# Patient Record
Sex: Female | Born: 2000 | Race: White | Hispanic: No | Marital: Single | Smoking: Former smoker
Health system: Southern US, Community
[De-identification: ages and names within clinical notes are randomized; demographics above are authoritative.]

## PROBLEM LIST (undated history)

## (undated) DIAGNOSIS — Z8616 Personal history of COVID-19: Secondary | ICD-10-CM

## (undated) DIAGNOSIS — F99 Mental disorder, not otherwise specified: Secondary | ICD-10-CM

## (undated) HISTORY — PX: TYMPANOSTOMY TUBE PLACEMENT: SHX32

## (undated) HISTORY — DX: Personal history of COVID-19: Z86.16

## (undated) HISTORY — DX: Mental disorder, not otherwise specified: F99

---

## 2020-10-25 ENCOUNTER — Emergency Department (INDEPENDENT_AMBULATORY_CARE_PROVIDER_SITE_OTHER)
Admission: EM | Admit: 2020-10-25 | Discharge: 2020-10-25 | Disposition: A | Discharge status: Home / Self Care | Payer: 59 | Source: Home / Self Care

## 2020-10-25 ENCOUNTER — Other Ambulatory Visit: Payer: Self-pay

## 2020-10-25 DIAGNOSIS — R519 Headache, unspecified: Secondary | ICD-10-CM

## 2020-10-25 DIAGNOSIS — Z20822 Contact with and (suspected) exposure to covid-19: Secondary | ICD-10-CM

## 2020-10-25 DIAGNOSIS — J069 Acute upper respiratory infection, unspecified: Secondary | ICD-10-CM

## 2020-10-25 NOTE — ED Triage Notes (Signed)
Patient presents to Urgent Care with complaints of headaches, body aches, chills, hot flashes since two days ago. Patient reports she was exposed to covid recently, has a productive cough, would like to be tested for covid.

## 2020-10-25 NOTE — Discharge Instructions (Signed)
°  You may take 500mg acetaminophen every 4-6 hours or in combination with ibuprofen 400-600mg every 6-8 hours as needed for pain, inflammation, and fever. ° °Be sure to well hydrated with clear liquids and get at least 8 hours of sleep at night, preferably more while sick.  ° °Please follow up with family medicine in 1 week if needed. ° °CDC recommends isolation for 5 days from symptom onset and fever free for 24 hours without use of fever reducing medication and symptoms improving. Then wear a well fitting mask for 5 days when around others. If not improving, isolate for 10 days and consider reevaluation by a medical provider to rule out a secondary bacterial infection.  ° °Call 911 or have someone drive you to the hospital if symptoms significantly worsening. ° ° °

## 2020-10-25 NOTE — ED Provider Notes (Signed)
[facility]    CSN: [id number] Arrival date & time: 10/25/20  1850      History   Chief Complaint Chief Complaint  Patient presents with  . Headache    HPI Diana Carpenter is a 20 y.o. female.   HPI Diana Carpenter is a 20 y.o. female presenting to UC with c/o generalized HA, body aches, chills, and hot flashes that started 2 days ago.  Known exposure to COVID, associated productive cough. Pt requesting a COVID tet. Denies fever, n/v/d. No chest pain or SOB.    History reviewed. No pertinent past medical history.  There are no problems to display for this patient.   History reviewed. No pertinent surgical history.  OB History   No obstetric history on file.      Home Medications    Prior to Admission medications   Not on File    Family History Family History  Problem Relation Age of Onset  . Hypertension Mother   . Cirrhosis Father     Social History Social History   Tobacco Use  . Smoking status: Never Smoker  . Smokeless tobacco: Never Used  Vaping Use  . Vaping Use: Every day  . Substances: Nicotine, Flavoring     Allergies   Patient has no known allergies.   Review of Systems Review of Systems  Constitutional: Positive for chills. Negative for fever.  HENT: Positive for congestion. Negative for ear pain, sore throat, trouble swallowing and voice change.   Respiratory: Positive for cough. Negative for shortness of breath.   Cardiovascular: Negative for chest pain and palpitations.  Gastrointestinal: Negative for abdominal pain, diarrhea, nausea and vomiting.  Musculoskeletal: Negative for arthralgias, back pain and myalgias.  Skin: Negative for rash.  Neurological: Positive for headaches. Negative for dizziness and light-headedness.  All other systems reviewed and are negative.    Physical Exam Triage Vital Signs ED Triage Vitals  Enc Vitals Group     BP 10/25/20 1913 (!) 144/73     Pulse Rate 10/25/20 1913 90     Resp  10/25/20 1913 16     Temp 10/25/20 1913 98.7 F (37.1 C)     Temp Source 10/25/20 1913 Oral     SpO2 10/25/20 1913 98 %     Weight --      Height --      Head Circumference --      Peak Flow --      Pain Score 10/25/20 1910 6     Pain Loc --      Pain Edu? --      Excl. in GC? --    No data found.  Updated Vital Signs BP (!) 144/73 (BP Location: Left Arm)   Pulse 90   Temp 98.7 F (37.1 C) (Oral)   Resp 16   LMP 10/15/2020 (Exact Date)   SpO2 98%   Visual Acuity Right Eye Distance:   Left Eye Distance:   Bilateral Distance:    Right Eye Near:   Left Eye Near:    Bilateral Near:     Physical Exam Vitals and nursing note reviewed.  Constitutional:      General: She is not in acute distress.    Appearance: She is well-developed and well-nourished. She is not ill-appearing, toxic-appearing or diaphoretic.  HENT:     Head: Normocephalic and atraumatic.     Right Ear: Tympanic membrane and ear canal normal.     Left Ear: Tympanic membrane and ear canal  normal.     Nose: Nose normal.     Right Sinus: No maxillary sinus tenderness or frontal sinus tenderness.     Left Sinus: No maxillary sinus tenderness or frontal sinus tenderness.     Mouth/Throat:     Lips: Pink.     Mouth: Mucous membranes are moist.     Pharynx: Oropharynx is clear. Uvula midline.  Eyes:     Extraocular Movements: EOM normal.  Cardiovascular:     Rate and Rhythm: Normal rate and regular rhythm.  Pulmonary:     Effort: Pulmonary effort is normal. No respiratory distress.     Breath sounds: Normal breath sounds. No stridor. No wheezing, rhonchi or rales.  Musculoskeletal:        General: Normal range of motion.     Cervical back: Normal range of motion and neck supple.  Skin:    General: Skin is warm and dry.  Neurological:     Mental Status: She is alert and oriented to person, place, and time.  Psychiatric:        Mood and Affect: Mood and affect normal.        Behavior: Behavior normal.       UC Treatments / Results  Labs (all labs ordered are listed, but only abnormal results are displayed) Labs Reviewed  COVID-19, FLU A+B NAA    EKG   Radiology No results found.  Procedures Procedures (including critical care time)  Medications Ordered in UC Medications - No data to display  Initial Impression / Assessment and Plan / UC Course  I have reviewed the triage vital signs and the nursing notes.  Pertinent labs & imaging results that were available during my care of the patient were reviewed by me and considered in my medical decision making (see chart for details).     No evidence of bacterial infection at this time. COVID/Flu pending Encouraged symptomatic tx F/u with PCP as needed AVS given  Final Clinical Impressions(s) / UC Diagnoses   Final diagnoses:  Close exposure to COVID-19 virus  Generalized headache  Viral upper respiratory tract infection with cough     Discharge Instructions      You may take 500mg  acetaminophen every 4-6 hours or in combination with ibuprofen 400-600mg  every 6-8 hours as needed for pain, inflammation, and fever.  Be sure to well hydrated with clear liquids and get at least 8 hours of sleep at night, preferably more while sick.   Please follow up with family medicine in 1 week if needed.  CDC recommends isolation for 5 days from symptom onset and fever free for 24 hours without use of fever reducing medication and symptoms improving. Then wear a well fitting mask for 5 days when around others. If not improving, isolate for 10 days and consider reevaluation by a medical provider to rule out a secondary bacterial infection.   Call 911 or have someone drive you to the hospital if symptoms significantly worsening.     ED Prescriptions    None     PDMP not reviewed this encounter.   , Lurene Shadow 10/26/20 1103

## 2020-10-28 ENCOUNTER — Telehealth: Payer: Self-pay | Admitting: Emergency Medicine

## 2020-10-28 LAB — COVID-19, FLU A+B NAA
Influenza A, NAA: NOT DETECTED
Influenza B, NAA: NOT DETECTED
SARS-CoV-2, NAA: DETECTED — AB

## 2020-10-28 NOTE — Telephone Encounter (Signed)
LMTRC.  Patient had called with questions regarding her COVID results.

## 2020-10-30 ENCOUNTER — Encounter: Payer: 59 | Admitting: Obstetrics & Gynecology

## 2020-10-31 ENCOUNTER — Encounter: Payer: 59 | Admitting: Advanced Practice Midwife

## 2020-11-07 ENCOUNTER — Ambulatory Visit (INDEPENDENT_AMBULATORY_CARE_PROVIDER_SITE_OTHER): Payer: 59 | Admitting: Certified Nurse Midwife

## 2020-11-07 ENCOUNTER — Other Ambulatory Visit (HOSPITAL_COMMUNITY)
Admission: RE | Admit: 2020-11-07 | Discharge: 2020-11-07 | Disposition: A | Discharge status: Home / Self Care | Payer: 59 | Source: Ambulatory Visit | Attending: Certified Nurse Midwife | Admitting: Certified Nurse Midwife

## 2020-11-07 ENCOUNTER — Other Ambulatory Visit: Payer: Self-pay

## 2020-11-07 ENCOUNTER — Encounter: Payer: Self-pay | Admitting: Certified Nurse Midwife

## 2020-11-07 VITALS — BP 104/70 | HR 100 | Resp 16 | Ht 64.0 in | Wt 147.0 lb

## 2020-11-07 DIAGNOSIS — Z113 Encounter for screening for infections with a predominantly sexual mode of transmission: Secondary | ICD-10-CM | POA: Diagnosis present

## 2020-11-07 DIAGNOSIS — Z01419 Encounter for gynecological examination (general) (routine) without abnormal findings: Secondary | ICD-10-CM | POA: Diagnosis present

## 2020-11-07 DIAGNOSIS — Z3009 Encounter for other general counseling and advice on contraception: Secondary | ICD-10-CM

## 2020-11-07 DIAGNOSIS — Z30011 Encounter for initial prescription of contraceptive pills: Secondary | ICD-10-CM

## 2020-11-07 LAB — POCT URINE PREGNANCY: Preg Test, Ur: NEGATIVE

## 2020-11-07 MED ORDER — TERCONAZOLE 0.4 % VA CREA: 1.0000 | TOPICAL_CREAM | Freq: Every day | VAGINAL | 0 refills | Status: DC

## 2020-11-07 MED ORDER — NORETHIN ACE-ETH ESTRAD-FE 1-20 MG-MCG(24) PO TABS: 1.0000 | ORAL_TABLET | Freq: Every day | ORAL | 3 refills | Status: DC

## 2020-11-07 MED ORDER — TOPIRAMATE 50 MG PO TABS: 50.0000 mg | ORAL_TABLET | Freq: Two times a day (BID) | ORAL | 1 refills | Status: DC

## 2020-11-07 MED ORDER — TOPIRAMATE 50 MG PO TABS: 50.0000 mg | ORAL_TABLET | Freq: Every day | ORAL | 1 refills | Status: DC

## 2020-11-07 NOTE — Progress Notes (Addendum)
GYNECOLOGY ANNUAL PREVENTATIVE CARE ENCOUNTER NOTE  History:     Diana Carpenter is a 20 y.o. G0P0000 female here for a new patient gynecologic exam.  Current complaints: wants birth control, vaginal lesion.   Denies abnormal vaginal bleeding, pelvic pain, problems with intercourse or other gynecologic concerns.  Patient request STD screening.   Gynecologic History Patient's last menstrual period was 10/15/2020 (exact date). Contraception: none  Obstetric History OB History  Gravida Para Term Preterm AB Living  0 0 0 0 0 0  SAB IAB Ectopic Multiple Live Births  0 0 0 0 0    Past Medical History:  Diagnosis Date  . History of COVID-19   . Mental disorder     Past Surgical History:  Procedure Laterality Date  . TYMPANOSTOMY TUBE PLACEMENT      No current outpatient medications on file prior to visit.   No current facility-administered medications on file prior to visit.    No Known Allergies  Social History:  reports that she has been smoking cigarettes. She has never used smokeless tobacco. She reports previous alcohol use. She reports previous drug use.  Family History  Problem Relation Age of Onset  . Hypertension Mother   . Cirrhosis Father   . Heart disease Father   . Infertility Sister   . Diabetes Maternal Grandfather     The following portions of the patient's history were reviewed and updated as appropriate: allergies, current medications, past family history, past medical history, past social history, past surgical history and problem list.  Review of Systems Pertinent items noted in HPI and remainder of comprehensive ROS otherwise negative.  Physical Exam:  BP 104/70   Pulse 100   Resp 16   Ht 5\' 4"  (1.626 m)   Wt 147 lb (66.7 kg)   LMP 10/15/2020 (Exact Date)   BMI 25.23 kg/m  CONSTITUTIONAL: Well-developed, well-nourished female in no acute distress.  HENT:  Normocephalic, atraumatic, External right and left ear normal.  EYES: Conjunctivae  and EOM are normal. Pupils are equal, round, and reactive to light.  NECK: Normal range of motion, supple, no masses.  Normal thyroid.  SKIN: Skin is warm and dry. No rash noted. Not diaphoretic. No erythema. No pallor. MUSCULOSKELETAL: Normal range of motion. No tenderness.  No cyanosis, clubbing, or edema.   NEUROLOGIC: Alert and oriented to person, place, and time. Normal reflexes, muscle tone coordination.  PSYCHIATRIC: Normal mood and affect. Normal behavior. Normal judgment and thought content. CARDIOVASCULAR: Normal heart rate noted, regular rhythm RESPIRATORY: Clear to auscultation bilaterally. Effort and breath sounds normal, no problems with respiration noted. BREASTS: Symmetric in size. No masses, tenderness, skin changes, nipple drainage, or lymphadenopathy bilaterally. Performed in the presence of a chaperone. ABDOMEN: Soft, no distention noted.  No tenderness, rebound or guarding.  PELVIC: Normal appearing external genitalia and urethral meatus; normal appearing vaginal mucosa and cervix. Large amount of yellow thick discharge present. Pap smear obtained.  Normal uterine size, no other palpable masses, no uterine or adnexal tenderness.  Performed in the presence of a chaperone.   Assessment and Plan:    1. Women's annual routine gynecological examination - Normal well woman examination  - Patient has ingrown hair present on left labia - educated and discussed use of ingrown hair serum or exfoliation to help prevent increase hairs  - Patient request refill for her topamax  - Cytology - PAP( [place]) - Urine Culture - topiramate (TOPAMAX) 50 MG tablet; Take 1 tablet (50  mg total) by mouth daily.  Dispense: 30 tablet; Refill: 1  2. Screening for STDs (sexually transmitted diseases) - Patient request STD screening today, patient is concerned about possible STD, educated and discussed with patient use of protection or making sure monogamist relationship with partner being treated  as well, patient verbalizes understanding  - Vaginal discharge consistent with yeast infection, Rx sent to pharmacy for treatment- discussed with patient that if STD positive then will call her for treatment   - HIV antibody (with reflex) - RPR - Hepatitis B Surface AntiGEN - Hepatitis C Antibody - Cervicovaginal ancillary only( [place])  3. Birth control counseling - Educated and discussed birth control options in detail with patient including side effects and risks  - Patient is wanting a low dose medication, is able to remember to take a pill  - POCT urine pregnancy  4. Encounter for initial prescription of contraceptive pills - Norethindrone Acetate-Ethinyl Estrad-FE (LOESTRIN 24 FE) 1-20 MG-MCG(24) tablet; Take 1 tablet by mouth daily.  Dispense: 90 tablet; Refill: 3  Will follow up results of pap smear/STD screening and manage accordingly. Routine preventative health maintenance measures emphasized. Please refer to After Visit Summary for other counseling recommendations.      Sharyon Cable, CNM [facility] [employer], [facility]

## 2020-11-07 NOTE — Progress Notes (Signed)
Took a Plan B pill on 10/21/20

## 2020-11-08 LAB — CERVICOVAGINAL ANCILLARY ONLY
Bacterial Vaginitis (gardnerella): NEGATIVE
Candida Glabrata: NEGATIVE
Candida Vaginitis: POSITIVE — AB
Comment: NEGATIVE
Comment: NEGATIVE
Comment: NEGATIVE
Comment: NEGATIVE
Trichomonas: NEGATIVE

## 2020-11-08 LAB — HEPATITIS C ANTIBODY
Hepatitis C Ab: NONREACTIVE
SIGNAL TO CUT-OFF: 0.01 (ref <1.00)

## 2020-11-08 LAB — RPR: RPR Ser Ql: NONREACTIVE

## 2020-11-08 LAB — HIV ANTIBODY (ROUTINE TESTING W REFLEX): HIV 1&2 Ab, 4th Generation: NONREACTIVE

## 2020-11-08 LAB — HEPATITIS B SURFACE ANTIGEN: Hepatitis B Surface Ag: NONREACTIVE

## 2020-11-09 LAB — URINE CULTURE
MICRO NUMBER:: 11511371
Result:: NO GROWTH
SPECIMEN QUALITY:: ADEQUATE

## 2020-11-14 LAB — CYTOLOGY - PAP
Adequacy: ABSENT
Chlamydia: NEGATIVE
Comment: NEGATIVE
Comment: NEGATIVE
Comment: NEGATIVE
Comment: NORMAL
Diagnosis: UNDETERMINED — AB
High risk HPV: NEGATIVE
Neisseria Gonorrhea: NEGATIVE
Trichomonas: NEGATIVE

## 2020-11-30 ENCOUNTER — Other Ambulatory Visit: Payer: Self-pay | Admitting: Certified Nurse Midwife

## 2020-11-30 DIAGNOSIS — Z01419 Encounter for gynecological examination (general) (routine) without abnormal findings: Secondary | ICD-10-CM

## 2021-01-22 ENCOUNTER — Ambulatory Visit: Payer: 59 | Admitting: Medical-Surgical

## 2021-01-22 DIAGNOSIS — Z7689 Persons encountering health services in other specified circumstances: Secondary | ICD-10-CM

## 2021-01-31 ENCOUNTER — Telehealth: Payer: Self-pay | Admitting: *Deleted

## 2021-01-31 NOTE — Telephone Encounter (Signed)
Left patient an urgent message to call and schedule MyChart visit on 02/01/2021 at 10:15 or 10:30 AM with Dr. Hyacinth Meeker or 02/02/2021 at 8:10 AM with Venia Carbon. Patient informed that days and times are as of this time today and subject to change as patients call to schedule appointments, might have to be another day.

## 2021-02-05 ENCOUNTER — Ambulatory Visit: Payer: 59 | Admitting: Medical-Surgical

## 2021-02-05 DIAGNOSIS — Z7689 Persons encountering health services in other specified circumstances: Secondary | ICD-10-CM

## 2021-02-06 ENCOUNTER — Ambulatory Visit (INDEPENDENT_AMBULATORY_CARE_PROVIDER_SITE_OTHER): Payer: 59 | Admitting: Medical-Surgical

## 2021-02-06 ENCOUNTER — Encounter: Payer: Self-pay | Admitting: Medical-Surgical

## 2021-02-06 ENCOUNTER — Ambulatory Visit: Payer: 59 | Admitting: Medical-Surgical

## 2021-02-06 ENCOUNTER — Other Ambulatory Visit: Payer: Self-pay

## 2021-02-06 VITALS — BP 103/60 | HR 82 | Temp 97.8°F | Ht 64.57 in | Wt 143.0 lb

## 2021-02-06 DIAGNOSIS — Z7689 Persons encountering health services in other specified circumstances: Secondary | ICD-10-CM

## 2021-02-06 DIAGNOSIS — R519 Headache, unspecified: Secondary | ICD-10-CM

## 2021-02-06 DIAGNOSIS — R309 Painful micturition, unspecified: Secondary | ICD-10-CM | POA: Diagnosis not present

## 2021-02-06 DIAGNOSIS — R350 Frequency of micturition: Secondary | ICD-10-CM | POA: Diagnosis not present

## 2021-02-06 DIAGNOSIS — R8290 Unspecified abnormal findings in urine: Secondary | ICD-10-CM

## 2021-02-06 DIAGNOSIS — N3001 Acute cystitis with hematuria: Secondary | ICD-10-CM

## 2021-02-06 LAB — POCT URINALYSIS DIP (CLINITEK)
Glucose, UA: NEGATIVE mg/dL
Nitrite, UA: NEGATIVE
POC PROTEIN,UA: 30 — AB
Spec Grav, UA: 1.02 (ref 1.010–1.025)
Urobilinogen, UA: 2 E.U./dL — AB
pH, UA: 6.5 (ref 5.0–8.0)

## 2021-02-06 MED ORDER — TOPIRAMATE 50 MG PO TABS: ORAL_TABLET | ORAL | 3 refills | Status: DC

## 2021-02-06 MED ORDER — PHENAZOPYRIDINE HCL 100 MG PO TABS: 100.0000 mg | ORAL_TABLET | Freq: Three times a day (TID) | ORAL | 0 refills | Status: DC | PRN

## 2021-02-06 MED ORDER — NITROFURANTOIN MONOHYD MACRO 100 MG PO CAPS: 100.0000 mg | ORAL_CAPSULE | Freq: Two times a day (BID) | ORAL | 0 refills | Status: DC

## 2021-02-06 MED ORDER — FLUCONAZOLE 150 MG PO TABS: 150.0000 mg | ORAL_TABLET | Freq: Once | ORAL | 0 refills | Status: AC

## 2021-02-06 NOTE — Progress Notes (Signed)
New Patient Office Visit  Subjective:  Patient ID: Diana Carpenter, female    DOB: January 13, 2001  Age: 20 y.o. MRN: [id number]  CC:  Chief Complaint  Patient presents with  . Urinary Tract Infection  . Vaginal Bleeding  . Migraine    HPI [name] presents to establish care.   Migraines-suffers from chronic daily headaches.  Was previously treated with Topamax 50 mg daily for prevention.  Tolerated the medication very well without side effects.  Her insurance lapsed and did not get reinstated until the beginning of the year.  She was without her medications for a while but did get it represcribed by her OB/GYN.  Unfortunately they gave her a 30-day supply with 1 refill and she has been out for the last month.  Once she ran out, her daily headaches resumed.  She would like to get started back on her medication today if at all possible.  She is aware that this medication may interfere with her birth control pills.  Dysuria-was seen by OB/GYN back in February for urinary symptoms.  At that time they discovered she had yeast vaginitis but no growth on her urine culture.  They prescribed her terconazole cream at bedtime x7 days which she completed as instructed.  Her symptoms resolved after this treatment but approximately 1.5 weeks ago, she had a resurgence of her symptoms including burning, frequency, urgency, and strong urinary odor.  She has also noticed some thickened clumpy white vaginal discharge.  She was not sure if this is related to her abnormal vaginal bleeding since starting birth control pills or not.  Past Medical History:  Diagnosis Date  . History of COVID-19   . Mental disorder     Past Surgical History:  Procedure Laterality Date  . TYMPANOSTOMY TUBE PLACEMENT    . TYMPANOSTOMY TUBE PLACEMENT     Family History  Problem Relation Age of Onset  . Hypertension Mother   . Migraines Mother   . Cirrhosis Father   . Heart disease Father   . Infertility Sister   . Migraines  Sister   . Diabetes Maternal Grandfather   . Migraines Maternal Grandmother     Social History   Socioeconomic History  . Marital status: Single    Spouse name: Not on file  . Number of children: Not on file  . Years of education: Not on file  . Highest education level: Not on file  Occupational History  . Not on file  Tobacco Use  . Smoking status: Light Tobacco Smoker    Types: Cigarettes  . Smokeless tobacco: Never Used  Vaping Use  . Vaping Use: Every day  . Substances: Nicotine, Flavoring  Substance and Sexual Activity  . Alcohol use: Yes    Alcohol/week: 1.0 - 2.0 standard drink    Types: 1 - 2 Standard drinks or equivalent per week  . Drug use: Not Currently  . Sexual activity: Yes    Partners: Male  Other Topics Concern  . Not on file  Social History Narrative  . Not on file   Social Determinants of Health   Financial Resource Strain: Not on file  Food Insecurity: Not on file  Transportation Needs: Not on file  Physical Activity: Not on file  Stress: Not on file  Social Connections: Not on file  Intimate Partner Violence: Not on file    ROS Review of Systems  Constitutional: Negative for chills, fatigue, fever and unexpected weight change.  Respiratory: Negative for cough, chest tightness,  shortness of breath and wheezing.   Genitourinary: Positive for dysuria, frequency, menstrual problem, urgency and vaginal bleeding.  Neurological: Positive for headaches. Negative for dizziness and light-headedness.  Psychiatric/Behavioral: Positive for dysphoric mood. Negative for self-injury, sleep disturbance and suicidal ideas. The patient is nervous/anxious.     Objective:   Today's Vitals: BP 103/60 (BP Location: Left Arm, Patient Position: Sitting, Cuff Size: Normal)   Pulse 82   Temp 97.8 F (36.6 C)   Ht 5' 4.57" (1.64 m)   Wt 143 lb (64.9 kg)   LMP 02/05/2021   SpO2 100%   BMI 24.12 kg/m   Physical Exam Vitals reviewed.  Constitutional:       General: She is not in acute distress.    Appearance: Normal appearance.  HENT:     Head: Normocephalic and atraumatic.  Cardiovascular:     Rate and Rhythm: Normal rate and regular rhythm.     Pulses: Normal pulses.     Heart sounds: Normal heart sounds. No murmur heard. No friction rub. No gallop.   Pulmonary:     Effort: Pulmonary effort is normal. No respiratory distress.     Breath sounds: Normal breath sounds. No wheezing.  Abdominal:     General: Bowel sounds are normal. There is no distension.     Palpations: Abdomen is soft. There is no mass.     Tenderness: There is no abdominal tenderness. There is no guarding or rebound.     Hernia: No hernia is present.  Skin:    General: Skin is warm and dry.  Neurological:     Mental Status: She is alert and oriented to person, place, and time.  Psychiatric:        Mood and Affect: Mood normal.        Behavior: Behavior normal.        Thought Content: Thought content normal.        Judgment: Judgment normal.     Assessment & Plan:   1. Encounter to establish care Reviewed available information and discussed healthcare concerns with patient.  2. Chronic daily headache Restart Topamax 50 mg daily.  3. Urinary frequency 4. Foul smelling urine 5. Painful urination 6. Abnormal urinalysis POCT UA positive for moderate blood, protein, small leukocytes, small bilirubin, and trace ketones.  Normal specific gravity, negative nitrites. - POCT URINALYSIS DIP (CLINITEK) - Urine Culture  7. Acute cystitis with hematuria With the severity of her symptoms and the findings on the POCT urinalysis, starting nitrofurantoin 100 mg twice daily x7 days.  Sending Pyridium 100 mg every 8 hours as needed for urinary pain.  Work note provided to allow time for these medications to get into her system and provide some relief.  Recommend increasing fluid intake.  Outpatient Encounter Medications as of 02/06/2021  Medication Sig  . Norethindrone  Acetate-Ethinyl Estrad-FE (LOESTRIN 24 FE) 1-20 MG-MCG(24) tablet Take 1 tablet by mouth daily.  . phenazopyridine (PYRIDIUM) 100 MG tablet Take 1 tablet (100 mg total) by mouth 3 (three) times daily as needed for pain.  Marland Kitchen topiramate (TOPAMAX) 50 MG tablet One half tab by mouth daily for one week then one tab by mouth daily.  . fluconazole (DIFLUCAN) 150 MG tablet Take 1 tablet (150 mg total) by mouth once for 1 dose.  . nitrofurantoin, macrocrystal-monohydrate, (MACROBID) 100 MG capsule Take 1 capsule (100 mg total) by mouth 2 (two) times daily.  . [DISCONTINUED] terconazole (TERAZOL 7) 0.4 % vaginal cream Place 1 applicator vaginally at bedtime.  Use for seven days  . [DISCONTINUED] topiramate (TOPAMAX) 50 MG tablet Take 1 tablet (50 mg total) by mouth daily.   No facility-administered encounter medications on file as of 02/06/2021.   Follow-up: Return in about 6 months (around 08/09/2021) for migraine follow up.   Thayer Ohm, DNP, APRN, FNP-BC [facility]

## 2021-02-06 NOTE — Patient Instructions (Signed)
Cystex

## 2021-02-08 LAB — URINE CULTURE
MICRO NUMBER:: 11874910
SPECIMEN QUALITY:: ADEQUATE

## 2021-02-09 ENCOUNTER — Encounter: Payer: Self-pay | Admitting: Certified Nurse Midwife

## 2021-02-09 ENCOUNTER — Other Ambulatory Visit: Payer: Self-pay

## 2021-02-09 ENCOUNTER — Telehealth (INDEPENDENT_AMBULATORY_CARE_PROVIDER_SITE_OTHER): Payer: 59 | Admitting: Certified Nurse Midwife

## 2021-02-09 DIAGNOSIS — Z3009 Encounter for other general counseling and advice on contraception: Secondary | ICD-10-CM | POA: Diagnosis not present

## 2021-02-09 DIAGNOSIS — N939 Abnormal uterine and vaginal bleeding, unspecified: Secondary | ICD-10-CM

## 2021-02-09 NOTE — Progress Notes (Deleted)
   GYNECOLOGY OFFICE VISIT NOTE  History:  20 y.o. G0P0000 here today for irregular spotting. Occurs after IC and randomly throughout the month. Sx onset 1 week after she started OCPs mid February. Also reports just 1 regular period since starting the OCPs. She is spotting almost every day. Reports more frequent UTIs and increased fatigue. She denies any abnormal vaginal discharge, pelvic pain or other concerns. Had negative STD screen in February. She is interested in switching to another form of birth control.   Past Medical History:  Diagnosis Date  . History of COVID-19   . Mental disorder     Past Surgical History:  Procedure Laterality Date  . TYMPANOSTOMY TUBE PLACEMENT    . TYMPANOSTOMY TUBE PLACEMENT      The following portions of the patient's history were reviewed and updated as appropriate: allergies, current medications, past family history, past medical history, past social history, past surgical history and problem list.   Health Maintenance:  Normal pap and negative HRHPV on ***.  Gardisil vaccine *** completed.  Review of Systems:  Negative except noted in HPI Genito-Urinary ROS: {[id number]}   Objective:  Physical Exam LMP 02/05/2021  CONSTITUTIONAL: Well-developed, well-nourished female in no acute distress.  HENT:  Normocephalic, atraumatic EYES: Conjunctivae and EOM are normal NECK: Normal range of motion SKIN: Skin is warm and dry NEUROLOGIC: Alert and oriented to person, place, and time PSYCHIATRIC: Normal mood and affect CARDIOVASCULAR: Normal heart rate noted RESPIRATORY: Effort and rate normal ABDOMEN: Soft, no distention  PELVIC: {Blank single:[id number]::"Deferred","Normal appearing external genitalia; normal appearing vaginal mucosa and cervix.  No abnormal discharge noted.  Normal uterine size, no other palpable masses, no uterine or adnexal tenderness."} MUSCULOSKELETAL: Normal range of motion  Labs and Imaging No results found for this or any  previous visit (from the past 24 hour(s)).  Assessment & Plan:  No diagnosis found.  Follow up ***   Total face-to-face time with patient: 17 minutes   Donette Larry, [place] 02/09/2021 10:32 AM

## 2021-02-09 NOTE — Progress Notes (Signed)
Pt c/o fatigue, nausea and constant bleeding since starting OCP. Would like to discuss other options. Bleeding sometimes increases after having intercourse.

## 2021-02-09 NOTE — Patient Instructions (Signed)

## 2021-02-09 NOTE — Progress Notes (Signed)
GYNECOLOGY VIRTUAL VISIT ENCOUNTER NOTE  Provider location: [facility]   Patient location: Home  I connected with Diana Carpenter on 02/09/21 at  9:50 AM EDT by MyChart Video Encounter and verified that I am speaking with the correct person using two identifiers.   I discussed the limitations, risks, security and privacy concerns of performing an evaluation and management service virtually and the availability of in person appointments. I also discussed with the patient that there may be a patient responsible charge related to this service. The patient expressed understanding and agreed to proceed.   History:  Diana Carpenter is a 20 y.o. G0P0000 here today for irregular spotting. Occurs after IC and randomly throughout the month. Sx onset 1 week after she started OCPs mid February. Also reports just 1 regular period since starting the OCPs. She is spotting almost every day. Reports more frequent UTIs and increased fatigue. She denies any abnormal vaginal discharge, pelvic pain or other concerns. Had negative STD screen in February. She is interested in switching to another form of birth control.     Past Medical History:  Diagnosis Date  . History of COVID-19   . Mental disorder    Past Surgical History:  Procedure Laterality Date  . TYMPANOSTOMY TUBE PLACEMENT    . TYMPANOSTOMY TUBE PLACEMENT     The following portions of the patient's history were reviewed and updated as appropriate: allergies, current medications, past family history, past medical history, past social history, past surgical history and problem list.   Health Maintenance:  ASCUS pap and negative HRHPV on 11/07/20.    Review of Systems:  Pertinent items noted in HPI and remainder of comprehensive ROS otherwise negative.  Physical Exam:   General:  Alert, oriented and cooperative. Patient appears to be in no acute distress.  Mental Status: Normal mood and affect. Normal behavior.  Normal judgment and thought content.   Respiratory: Normal respiratory effort, no problems with respiration noted  Rest of physical exam deferred due to type of encounter  Labs and Imaging Results for orders placed or performed in visit on 02/06/21 (from the past 336 hour(s))  Urine Culture   Collection Time: 02/06/21  1:43 PM   Specimen: Urine  Result Value Ref Range   MICRO NUMBER: [id number]    SPECIMEN QUALITY: Adequate    Sample Source NOT GIVEN    STATUS: FINAL    ISOLATE 1: Escherichia coli (A)       Susceptibility   Escherichia coli - URINE CULTURE, REFLEX    AMOX/CLAVULANIC 4 Sensitive     AMPICILLIN 4 Sensitive     AMPICILLIN/SULBACTAM <=2 Sensitive     CEFAZOLIN* <=4 Not Reportable      * For infections other than uncomplicated UTIcaused by E. coli, K. pneumoniae or P. mirabilis:Cefazolin is resistant if MIC > or = 8 mcg/mL.(Distinguishing susceptible versus intermediatefor isolates with MIC < or = 4 mcg/mL requiresadditional testing.)For uncomplicated UTI caused by E. coli,K. pneumoniae or P. mirabilis: Cefazolin issusceptible if MIC <32 mcg/mL and predictssusceptible to the oral agents cefaclor, cefdinir,cefpodoxime, cefprozil, cefuroxime, cephalexinand loracarbef.    CEFEPIME <=1 Sensitive     CEFTRIAXONE <=1 Sensitive     CIPROFLOXACIN <=0.25 Sensitive     LEVOFLOXACIN <=0.12 Sensitive     ERTAPENEM <=0.5 Sensitive     GENTAMICIN <=1 Sensitive     IMIPENEM <=0.25 Sensitive     NITROFURANTOIN <=16 Sensitive     PIP/TAZO <=4 Sensitive  TOBRAMYCIN <=1 Sensitive     TRIMETH/SULFA* <=20 Sensitive      * For infections other than uncomplicated UTIcaused by E. coli, K. pneumoniae or P. mirabilis:Cefazolin is resistant if MIC > or = 8 mcg/mL.(Distinguishing susceptible versus intermediatefor isolates with MIC < or = 4 mcg/mL requiresadditional testing.)For uncomplicated UTI caused by E. coli,K. pneumoniae or P. mirabilis: Cefazolin issusceptible if MIC <32 mcg/mL and  predictssusceptible to the oral agents cefaclor, cefdinir,cefpodoxime, cefprozil, cefuroxime, cephalexinand loracarbef.Legend:S = Susceptible  I = IntermediateR = Resistant  NS = Not susceptible* = Not tested  NR = Not reported**NN = See antimicrobic comments  POCT URINALYSIS DIP (CLINITEK)   Collection Time: 02/06/21  1:44 PM  Result Value Ref Range   Color, UA yellow yellow   Clarity, UA clear clear   Glucose, UA negative negative mg/dL   Bilirubin, UA small (A) negative   Ketones, POC UA trace (5) (A) negative mg/dL   Spec Grav, UA 6.468 0.321 - 1.025   Blood, UA moderate (A) negative   pH, UA 6.5 5.0 - 8.0   POC PROTEIN,UA =30 (A) negative, trace   Urobilinogen, UA 2.0 (A) 0.2 or 1.0 E.U./dL   Nitrite, UA Negative Negative   Leukocytes, UA Small (1+) (A) Negative   No results found.     Assessment and Plan:   1. Abnormal uterine bleeding (AUB)   2. Encounter for counseling regarding contraception    Discussed all forms of contraception Pt interested in IUD, and will call to schedule insertion  I discussed the assessment and treatment plan with the patient. The patient was provided an opportunity to ask questions and all were answered. The patient agreed with the plan and demonstrated an understanding of the instructions.   The patient was advised to call back or seek an in-person evaluation/go to the ED if the symptoms worsen or if the condition fails to improve as anticipated.  I provided 17 minutes of face-to-face time during this encounter.   Donette Larry, CNM [facility] [employer], [facility]

## 2021-02-12 ENCOUNTER — Encounter: Payer: Self-pay | Admitting: Medical-Surgical

## 2021-02-23 ENCOUNTER — Encounter: Payer: Self-pay | Admitting: Certified Nurse Midwife

## 2021-02-23 ENCOUNTER — Ambulatory Visit (INDEPENDENT_AMBULATORY_CARE_PROVIDER_SITE_OTHER): Payer: 59 | Admitting: Certified Nurse Midwife

## 2021-02-23 ENCOUNTER — Other Ambulatory Visit (HOSPITAL_COMMUNITY)
Admission: RE | Admit: 2021-02-23 | Discharge: 2021-02-23 | Disposition: A | Discharge status: Home / Self Care | Payer: 59 | Source: Ambulatory Visit | Attending: Certified Nurse Midwife | Admitting: Certified Nurse Midwife

## 2021-02-23 ENCOUNTER — Other Ambulatory Visit: Payer: Self-pay

## 2021-02-23 VITALS — BP 112/77 | HR 107 | Ht 64.0 in | Wt 138.0 lb

## 2021-02-23 DIAGNOSIS — Z30430 Encounter for insertion of intrauterine contraceptive device: Secondary | ICD-10-CM | POA: Diagnosis not present

## 2021-02-23 DIAGNOSIS — N898 Other specified noninflammatory disorders of vagina: Secondary | ICD-10-CM | POA: Insufficient documentation

## 2021-02-23 DIAGNOSIS — Z01812 Encounter for preprocedural laboratory examination: Secondary | ICD-10-CM

## 2021-02-23 LAB — POCT URINE PREGNANCY: Preg Test, Ur: NEGATIVE

## 2021-02-23 MED ORDER — LEVONORGESTREL 19.5 MG IU IUD
1.0000 | INTRAUTERINE_SYSTEM | Freq: Once | INTRAUTERINE | Status: AC
Start: 2021-02-23 — End: 2021-02-23
  Administered 2021-02-23: 1 via INTRAUTERINE

## 2021-02-23 NOTE — Progress Notes (Signed)
   GYNECOLOGY OFFICE VISIT NOTE  History:  20 y.o. G0P0000 here today for IUD insertion. She has been having intermenstrual spotting and bleeding on OCPs and would like to switch to [place] IUD. She is sexually active. No new partner. Denies any abnormal vaginal discharge or pelvic pain.   Past Medical History:  Diagnosis Date  . History of COVID-19   . Mental disorder     Past Surgical History:  Procedure Laterality Date  . TYMPANOSTOMY TUBE PLACEMENT    . TYMPANOSTOMY TUBE PLACEMENT      The following portions of the patient's history were reviewed and updated as appropriate: allergies, current medications, past family history, past medical history, past social history, past surgical history and problem list.   Health Maintenance:  papsmear with ASCUS and negative HRHPV on 11/07/20.    Review of Systems:  Negative except noted in HPI  Objective:  Physical Exam BP 112/77   Pulse (!) 107   Ht 5\' 4"  (1.626 m)   Wt 138 lb (62.6 kg)   LMP 02/05/2021   BMI 23.69 kg/m  CONSTITUTIONAL: Well-developed, well-nourished female in no acute distress.  HENT:  Normocephalic, atraumatic EYES: Conjunctivae and EOM are normal NECK: Normal range of motion SKIN: Skin is warm and dry NEUROLOGIC: Alert and oriented to person, place, and time PSYCHIATRIC: Normal mood and affect CARDIOVASCULAR: Normal heart rate noted RESPIRATORY: Effort and rate normal PELVIC: Normal appearing external genitalia; normal appearing vaginal mucosa and cervix. Thin curdy yellow discharge present. No abnormal discharge noted.  Normal uterine size, no other palpable masses, no uterine or adnexal tenderness."} MUSCULOSKELETAL: Normal range of motion  Labs and Imaging Results for orders placed or performed in visit on 02/23/21 (from the past 24 hour(s))  POCT urine pregnancy     Status: None   Collection Time: 02/23/21 10:42 AM  Result Value Ref Range   Preg Test, Ur Negative Negative   Procedure Note: Pt  consented for IUD insertion. Pt placed in lithotomy position. Speculum inserted and cervix, hurricane spray applied, then cleaned with betadine solution. Cervix dilated with yellow dilators. Uterus sounded to 5 cm; Kyleena IUD inserted without difficulty. Strings cut at approximately 3 cm. Speculum removed. Tolerated well.  Assessment & Plan:   1. Pre-procedure lab exam   2. Encounter for IUD insertion   3. Vaginal discharge    Aptima swab Follow up in 1 month for string check   02/25/21, CNM 02/23/2021 10:43 AM

## 2021-02-27 ENCOUNTER — Ambulatory Visit (INDEPENDENT_AMBULATORY_CARE_PROVIDER_SITE_OTHER): Payer: 59 | Admitting: Advanced Practice Midwife

## 2021-02-27 ENCOUNTER — Encounter: Payer: Self-pay | Admitting: Advanced Practice Midwife

## 2021-02-27 ENCOUNTER — Other Ambulatory Visit: Payer: Self-pay

## 2021-02-27 DIAGNOSIS — Z975 Presence of (intrauterine) contraceptive device: Secondary | ICD-10-CM | POA: Insufficient documentation

## 2021-02-27 DIAGNOSIS — R87610 Atypical squamous cells of undetermined significance on cytologic smear of cervix (ASC-US): Secondary | ICD-10-CM | POA: Insufficient documentation

## 2021-02-27 DIAGNOSIS — R102 Pelvic and perineal pain: Secondary | ICD-10-CM

## 2021-02-27 DIAGNOSIS — T8384XA Pain from genitourinary prosthetic devices, implants and grafts, initial encounter: Secondary | ICD-10-CM

## 2021-02-27 LAB — CERVICOVAGINAL ANCILLARY ONLY
Bacterial Vaginitis (gardnerella): NEGATIVE
Candida Glabrata: NEGATIVE
Candida Vaginitis: NEGATIVE
Chlamydia: NEGATIVE
Comment: NEGATIVE
Comment: NEGATIVE
Comment: NEGATIVE
Comment: NEGATIVE
Comment: NEGATIVE
Comment: NORMAL
Neisseria Gonorrhea: NEGATIVE
Trichomonas: NEGATIVE

## 2021-02-27 MED ORDER — NAPROXEN SODIUM 550 MG PO TABS: 550.0000 mg | ORAL_TABLET | Freq: Two times a day (BID) | ORAL | 0 refills | Status: DC

## 2021-02-27 NOTE — Progress Notes (Signed)
  GYNECOLOGY PROGRESS NOTE  History:  20 y.o. G0P0000 presents to [facility] office today for problem gyn visit. She reports uterine cramping since IUD was placed 02/23/21.  She is taking ibuprofen and Tylenol and is still in severe pain.  She did work a double shift of 15 hours on 5/29 and 5/30 and pain was worse with working.  She denies h/a, dizziness, shortness of breath, n/v, or fever/chills.    The following portions of the patient's history were reviewed and updated as appropriate: allergies, current medications, past family history, past medical history, past social history, past surgical history and problem list. Last pap smear on 11/07/20 was abnormal with ASCUS, negative HRHPV.  Health Maintenance Due  Topic Date Due  . HPV VACCINES (1 - 2-dose series) Never done  . TETANUS/TDAP  Never done     Review of Systems:  Pertinent items are noted in HPI.   Objective:  Physical Exam Last menstrual period 02/05/2021. VS reviewed, nursing note reviewed,  Constitutional: well developed, well nourished, no distress HEENT: normocephalic CV: normal rate Pulm/chest wall: normal effort Breast Exam: deferred Abdomen: soft Neuro: alert and oriented x 3 Skin: warm, dry Psych: affect normal Pelvic exam: Cervix pink, visually closed, without lesion, scant white creamy discharge, IUD string visible and ~ 3 cm in length from cervical os, vaginal walls and external genitalia normal  Assessment & Plan:  1. Pain due to intrauterine contraceptive device (IUD), initial encounter (HCC) --Pain since insertion on 02/23/21.  Pt reports she worked a double shift, 15 hours Sunday and yesterday and reports significant cramping while at work despite taking ibuprofen and Tylenol. --Exam today wnl, string ~ 3 cm in length from cervical os --NSAIDs, heat/ice/Tylenol, outpatient [place] to look at position --Letter provided for pt to miss work x 3 days for rest. Pelvic rest x 3-4 days or while pain  persists. --F/U in office within a few days after ultrasound  - US PELVIC COMPLETE WITH TRANSVAGINAL; Future - naproxen sodium (ANAPROX DS) 550 MG tablet; Take 1 tablet (550 mg total) by mouth 2 (two) times daily with a meal.  Dispense: 30 tablet; Refill: 0  2. Acute pelvic pain, female  - US PELVIC COMPLETE WITH TRANSVAGINAL; Future  Sharen Counter, CNM 4:11 PM

## 2021-03-02 ENCOUNTER — Ambulatory Visit (INDEPENDENT_AMBULATORY_CARE_PROVIDER_SITE_OTHER): Payer: 59

## 2021-03-02 ENCOUNTER — Other Ambulatory Visit: Payer: Self-pay

## 2021-03-02 DIAGNOSIS — T8384XA Pain from genitourinary prosthetic devices, implants and grafts, initial encounter: Secondary | ICD-10-CM

## 2021-03-02 DIAGNOSIS — R102 Pelvic and perineal pain: Secondary | ICD-10-CM

## 2021-03-23 ENCOUNTER — Other Ambulatory Visit: Payer: Self-pay

## 2021-03-23 ENCOUNTER — Other Ambulatory Visit (HOSPITAL_COMMUNITY)
Admission: RE | Admit: 2021-03-23 | Discharge: 2021-03-23 | Disposition: A | Discharge status: Home / Self Care | Payer: 59 | Source: Ambulatory Visit | Attending: Certified Nurse Midwife | Admitting: Certified Nurse Midwife

## 2021-03-23 ENCOUNTER — Ambulatory Visit (INDEPENDENT_AMBULATORY_CARE_PROVIDER_SITE_OTHER): Payer: 59 | Admitting: Certified Nurse Midwife

## 2021-03-23 ENCOUNTER — Encounter: Payer: Self-pay | Admitting: Certified Nurse Midwife

## 2021-03-23 VITALS — BP 98/65 | HR 88 | Resp 16 | Ht 64.0 in | Wt 132.0 lb

## 2021-03-23 DIAGNOSIS — R309 Painful micturition, unspecified: Secondary | ICD-10-CM | POA: Diagnosis not present

## 2021-03-23 DIAGNOSIS — N898 Other specified noninflammatory disorders of vagina: Secondary | ICD-10-CM | POA: Insufficient documentation

## 2021-03-23 DIAGNOSIS — Z30431 Encounter for routine checking of intrauterine contraceptive device: Secondary | ICD-10-CM

## 2021-03-23 LAB — POCT URINALYSIS DIPSTICK
Glucose, UA: NEGATIVE
Ketones, UA: NEGATIVE
Nitrite, UA: POSITIVE
Protein, UA: NEGATIVE
Spec Grav, UA: 1.02 (ref 1.010–1.025)
Urobilinogen, UA: NEGATIVE E.U./dL — AB
pH, UA: 6.5 (ref 5.0–8.0)

## 2021-03-23 NOTE — Progress Notes (Signed)
Pt feels like she may have UTI.  Burning and pain with urination x 1 week.

## 2021-03-23 NOTE — Progress Notes (Signed)
GYNECOLOGY OFFICE VISIT NOTE  History:  20 y.o. G0P0000 here today for IUD string check. Liletta IUD was placed on 02/09/21. She denies any abnormal vaginal discharge, pelvic pain or other concerns. Having almost daily pink/brown/red spotting. Also having daily cramping. Has not taken anything. No dyspareunia. Reports 1 week of difficult and painful urination. Has tried AZO which helps. Also having vaginal itching. No new partner.   Past Medical History:  Diagnosis Date   History of COVID-19    Mental disorder     Past Surgical History:  Procedure Laterality Date   TYMPANOSTOMY TUBE PLACEMENT     TYMPANOSTOMY TUBE PLACEMENT      The following portions of the patient's history were reviewed and updated as appropriate: allergies, current medications, past family history, past medical history, past social history, past surgical history and problem list.   Review of Systems:  Pertinent items noted in HPI and remainder of comprehensive ROS otherwise negative. +dysuria +VB  Objective:  Physical Exam BP 98/65   Pulse 88   Resp 16   Ht 5\' 4"  (1.626 m)   Wt 132 lb (59.9 kg)   BMI 22.66 kg/m  CONSTITUTIONAL: Well-developed, well-nourished female in no acute distress.  HENT:  Normocephalic, atraumatic.  SKIN: Skin is warm and dry. No rash noted. Not diaphoretic. No erythema. No pallor. NEUROLOGIC: Alert and oriented to person, place, and time.  PSYCHIATRIC: Normal mood and affect. Normal behavior. Normal judgment and thought content. CARDIOVASCULAR: Normal heart rate noted RESPIRATORY: Effort and rate normal ABDOMEN: Soft, no distention noted.   PELVIC: Normal appearing external genitalia; normal appearing vaginal mucosa and cervix. IUD string seen, approx 3cm. Scant amt of dark red blood. No abnormal discharge noted.    Results for orders placed or performed in visit on 03/23/21 (from the past 24 hour(s))  POCT Urinalysis Dipstick     Status: Abnormal   Collection Time: 03/23/21   9:55 AM  Result Value Ref Range   Color, UA orange    Clarity, UA clear    Glucose, UA Negative Negative   Bilirubin, UA     Ketones, UA neg    Spec Grav, UA 1.020 1.010 - 1.025   Blood, UA mod    pH, UA 6.5 5.0 - 8.0   Protein, UA Negative Negative   Urobilinogen, UA negative (A) 0.2 or 1.0 E.U./dL   Nitrite, UA pos    Leukocytes, UA Small (1+) (A) Negative   Appearance     Odor     Assessment & Plan:  1. Painful urination - POCT Urinalysis Dipstick - Urine Culture - AZO OTC  2. Vaginal itching - Aptima swab - Cervicovaginal ancillary only( [place]) - sitz bath with baking soda  3. IUD check - had 03/25/21 that confirmed correct placement - Ibuprofen/Aleve prn - consider OCP to bridge if bleeding doesn't improve over the next 1-2 mos   Please refer to After Visit Summary for other counseling recommendations.   Return if symptoms worsen or fail to improve.   [place], CNM 03/23/2021 11:51 AM

## 2021-03-25 LAB — URINE CULTURE
MICRO NUMBER:: 12047639
SPECIMEN QUALITY:: ADEQUATE

## 2021-03-26 ENCOUNTER — Telehealth: Payer: Self-pay | Admitting: *Deleted

## 2021-03-26 LAB — CERVICOVAGINAL ANCILLARY ONLY
Bacterial Vaginitis (gardnerella): NEGATIVE
Candida Glabrata: NEGATIVE
Candida Vaginitis: POSITIVE — AB
Comment: NEGATIVE
Comment: NEGATIVE
Comment: NEGATIVE

## 2021-03-26 MED ORDER — CEPHALEXIN 500 MG PO CAPS: ORAL_CAPSULE | ORAL | 0 refills | Status: DC

## 2021-03-26 MED ORDER — CEPHALEXIN 500 MG PO CAPS: 500.0000 mg | ORAL_CAPSULE | Freq: Three times a day (TID) | ORAL | 0 refills | Status: DC

## 2021-03-26 NOTE — Telephone Encounter (Signed)
Pt notified of positive urine culture and per VO M Bhambri,CNM Keflex 500 mg sent to [facility] [address] in [place].

## 2021-03-26 NOTE — Telephone Encounter (Signed)
-----   Message from Donette Larry, [place] sent at 03/26/2021 11:59 AM EDT ----- +uti. Can you send Keflex 500 mg bid x7 days

## 2021-03-28 ENCOUNTER — Other Ambulatory Visit: Payer: Self-pay | Admitting: Certified Nurse Midwife

## 2021-03-28 DIAGNOSIS — B3731 Acute candidiasis of vulva and vagina: Secondary | ICD-10-CM

## 2021-03-28 MED ORDER — FLUCONAZOLE 150 MG PO TABS: 150.0000 mg | ORAL_TABLET | Freq: Once | ORAL | 0 refills | Status: AC

## 2021-03-28 NOTE — Progress Notes (Signed)
See my chart message

## 2021-04-03 ENCOUNTER — Telehealth: Payer: Self-pay

## 2021-04-03 ENCOUNTER — Encounter: Payer: Self-pay | Admitting: Medical-Surgical

## 2021-04-03 NOTE — Telephone Encounter (Signed)
Pt called stating her UTI is getting worse after treatment and she is having very intense vaginal itching. We have no available appts today or tomorrow. Pt was told to go be seen by urgent care. Pt expressed understanding.

## 2021-04-04 ENCOUNTER — Other Ambulatory Visit: Payer: Self-pay | Admitting: Certified Nurse Midwife

## 2021-04-04 DIAGNOSIS — N3000 Acute cystitis without hematuria: Secondary | ICD-10-CM

## 2021-04-04 MED ORDER — SULFAMETHOXAZOLE-TRIMETHOPRIM 800-160 MG PO TABS: 1.0000 | ORAL_TABLET | Freq: Two times a day (BID) | ORAL | 0 refills | Status: DC

## 2021-04-04 NOTE — Progress Notes (Addendum)
+  UTI, didn't respond to Keflex, Bactrim sent.

## 2021-04-05 ENCOUNTER — Ambulatory Visit: Payer: 59 | Admitting: Medical-Surgical

## 2021-04-16 ENCOUNTER — Ambulatory Visit (INDEPENDENT_AMBULATORY_CARE_PROVIDER_SITE_OTHER): Payer: 59 | Admitting: Medical-Surgical

## 2021-04-16 ENCOUNTER — Encounter: Payer: Self-pay | Admitting: Medical-Surgical

## 2021-04-16 ENCOUNTER — Other Ambulatory Visit: Payer: Self-pay

## 2021-04-16 VITALS — BP 91/62 | HR 70 | Temp 98.6°F | Ht 64.0 in | Wt 128.0 lb

## 2021-04-16 DIAGNOSIS — R197 Diarrhea, unspecified: Secondary | ICD-10-CM | POA: Diagnosis not present

## 2021-04-16 DIAGNOSIS — R300 Dysuria: Secondary | ICD-10-CM | POA: Diagnosis not present

## 2021-04-16 LAB — POCT URINALYSIS DIP (CLINITEK)
Bilirubin, UA: NEGATIVE
Blood, UA: NEGATIVE
Glucose, UA: NEGATIVE mg/dL
Ketones, POC UA: NEGATIVE mg/dL
Leukocytes, UA: NEGATIVE
Nitrite, UA: NEGATIVE
POC PROTEIN,UA: NEGATIVE
Spec Grav, UA: 1.015 (ref 1.010–1.025)
Urobilinogen, UA: 1 E.U./dL
pH, UA: 8.5 — AB (ref 5.0–8.0)

## 2021-04-16 LAB — WET PREP FOR TRICH, YEAST, CLUE
MICRO NUMBER:: 12130729
Specimen Quality: ADEQUATE

## 2021-04-16 MED ORDER — CIPROFLOXACIN HCL 500 MG PO TABS: 500.0000 mg | ORAL_TABLET | Freq: Two times a day (BID) | ORAL | 0 refills | Status: AC

## 2021-04-16 NOTE — Progress Notes (Signed)
Subjective:    CC: diarrhea, dysuria  HPI: Pleasant 20 year old female presenting today with complaints of dysuria. Was recently treated with Bactrim for a UTI and her symptoms greatly improved but have not fully resolved. Still having some difficulty emptying her bladder fully. Has noted vaginal discharge that is brown at times and has pink discharge on the tissue when she wipes. She is sexually active with 1 female partner in a monogamous relationship. Does not use condoms. Not concerned for STIs.   Developed diarrhea yesterday evening after a bout of constipation lately. She has been 4 times since this morning and reports the stool is bright orangish-yellow. She has not seen any blood or black stools.  No new foods or restaurants recently.  Notes that while she was at work yesterday, a toilet overflowed, spewing feces and urine all over the floor in the bathroom.  She was required to go in and clean that up and is worried that she may have been exposed to E. coli.  I reviewed the past medical history, family history, social history, surgical history, and allergies today and no changes were needed.  Please see the problem list section below in epic for further details.  Past Medical History: Past Medical History:  Diagnosis Date   History of COVID-19    Mental disorder    Past Surgical History: Past Surgical History:  Procedure Laterality Date   TYMPANOSTOMY TUBE PLACEMENT     TYMPANOSTOMY TUBE PLACEMENT     Social History: Social History   Socioeconomic History   Marital status: Single    Spouse name: Not on file   Number of children: Not on file   Years of education: Not on file   Highest education level: Not on file  Occupational History   Not on file  Tobacco Use   Smoking status: Light Smoker    Types: Cigarettes   Smokeless tobacco: Never  Vaping Use   Vaping Use: Every day   Substances: Nicotine, Flavoring  Substance and Sexual Activity   Alcohol use: Yes     Alcohol/week: 1.0 - 2.0 standard drink    Types: 1 - 2 Standard drinks or equivalent per week   Drug use: Not Currently   Sexual activity: Yes    Partners: Male    Birth control/protection: I.U.D.  Other Topics Concern   Not on file  Social History Narrative   Not on file   Social Determinants of Health   Financial Resource Strain: Not on file  Food Insecurity: Not on file  Transportation Needs: Not on file  Physical Activity: Not on file  Stress: Not on file  Social Connections: Not on file   Family History: Family History  Problem Relation Age of Onset   Hypertension Mother    Migraines Mother    Cirrhosis Father    Heart disease Father    Infertility Sister    Migraines Sister    Diabetes Maternal Grandfather    Migraines Maternal Grandmother    Allergies: Allergies  Allergen Reactions   Other Rash    Tide products   Medications: See med rec.  Review of Systems: See HPI for pertinent positives and negatives.   Objective:    General: Well Developed, well nourished, and in no acute distress.  Neuro: Alert and oriented x3.  HEENT: Normocephalic, atraumatic.  Skin: Warm and dry. Cardiac: Regular rate and rhythm, no murmurs rubs or gallops, no lower extremity edema.  Respiratory: Clear to auscultation bilaterally. Not using accessory muscles,  speaking in full sentences.  Impression and Recommendations:    1. Dysuria POCT UA negative for any concerns.  Wet prep negative as well. - POCT URINALYSIS DIP (CLINITEK) - WET PREP FOR TRICH, YEAST, CLUE  2. Diarrhea of presumed infectious origin Given her recent exposure and recent onset diarrhea, we will go ahead and treat empirically with Cipro 500 mg twice daily x3 days.  Monitor for symptoms of a yeast infection and if these arise, advised patient to let me know so I can call in Diflucan.  Return if symptoms worsen or fail to improve. ___________________________________________ Thayer Ohm, DNP, APRN,  FNP-BC Primary Care and Sports Medicine [facility] [place]

## 2021-06-11 ENCOUNTER — Other Ambulatory Visit: Payer: Self-pay

## 2021-06-11 ENCOUNTER — Emergency Department (INDEPENDENT_AMBULATORY_CARE_PROVIDER_SITE_OTHER)
Admission: EM | Admit: 2021-06-11 | Discharge: 2021-06-11 | Disposition: A | Discharge status: Home / Self Care | Payer: 59 | Source: Home / Self Care | Attending: Family Medicine | Admitting: Family Medicine

## 2021-06-11 DIAGNOSIS — R0981 Nasal congestion: Secondary | ICD-10-CM | POA: Diagnosis not present

## 2021-06-11 DIAGNOSIS — J029 Acute pharyngitis, unspecified: Secondary | ICD-10-CM

## 2021-06-11 DIAGNOSIS — R6889 Other general symptoms and signs: Secondary | ICD-10-CM

## 2021-06-11 NOTE — ED Provider Notes (Signed)
[facility]    CSN: [id number] Arrival date & time: 06/11/21  1729      History   Chief Complaint Chief Complaint  Patient presents with   Nausea   Otalgia   Generalized Body Aches    HPI Diana Carpenter is a 20 y.o. female.   HPI  Patient states she has had nausea, diarrhea.  Fever and chills.  Headache and body ache.  Fatigue.  Runny and stuffy nose.  Ear pain.  This is been present for 3 days.  She was sent home from work early and told to get a COVID test.  She is here today to be tested.  She is not scheduled to work for another 3 days.  She is COVID vaccinated.  She states that her family had COVID 2 weeks ago but she did not.  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  Past Medical History:  Diagnosis Date   History of COVID-19    Mental disorder     Patient Active Problem List   Diagnosis Date Noted   ASCUS of cervix with negative high risk HPV 02/27/2021   IUD (intrauterine device) in place 02/27/2021    Past Surgical History:  Procedure Laterality Date   TYMPANOSTOMY TUBE PLACEMENT     TYMPANOSTOMY TUBE PLACEMENT      OB History     Gravida  0   Para  0   Term  0   Preterm  0   AB  0   Living  0      SAB  0   IAB  0   Ectopic  0   Multiple  0   Live Births  0            Home Medications    Prior to Admission medications   Medication Sig Start Date End Date Taking? Authorizing Provider  Levonorgestrel (KYLEENA IU) by Intrauterine route.    [provider]  topiramate (TOPAMAX) 50 MG tablet One half tab by mouth daily for one week then one tab by mouth daily. 02/06/21   Christen Butter, NP    Family History Family History  Problem Relation Age of Onset   Hypertension Mother    Migraines Mother    Cirrhosis Father    Heart disease Father    Infertility Sister    Migraines Sister    Diabetes Maternal Grandfather    Migraines Maternal Grandmother     Social History Social History    Tobacco Use   Smoking status: Former    Types: Cigarettes   Smokeless tobacco: Never  Vaping Use   Vaping Use: Every day   Substances: Nicotine, Flavoring  Substance Use Topics   Alcohol use: Not Currently   Drug use: Not Currently     Allergies   Other   Review of Systems Review of Systems See HPI  Physical Exam Triage Vital Signs ED Triage Vitals  Enc Vitals Group     BP 06/11/21 1803 116/65     Pulse Rate 06/11/21 1803 72     Resp 06/11/21 1803 20     Temp 06/11/21 1803 98.3 F (36.8 C)     Temp Source 06/11/21 1803 Oral     SpO2 06/11/21 1803 100 %     Weight 06/11/21 1800 125 lb (56.7 kg)     Height 06/11/21 1800 5\' 5"  (1.651 m)     Head Circumference --  Peak Flow --      Pain Score 06/11/21 1759 5     Pain Loc --      Pain Edu? --      Excl. in GC? --    No data found.  Updated Vital Signs BP 116/65 (BP Location: Right Arm)   Pulse 72   Temp 98.3 F (36.8 C) (Oral)   Resp 20   Ht 5\' 5"  (1.651 m)   Wt 56.7 kg   SpO2 100%   BMI 20.80 kg/m      Physical Exam Constitutional:      General: She is not in acute distress.    Appearance: Normal appearance. She is well-developed and normal weight.  HENT:     Head: Normocephalic and atraumatic.     Right Ear: Tympanic membrane, ear canal and external ear normal.     Left Ear: Tympanic membrane, ear canal and external ear normal.     Nose: Congestion and rhinorrhea present.     Mouth/Throat:     Mouth: Mucous membranes are moist.     Pharynx: Posterior oropharyngeal erythema present.  Eyes:     Conjunctiva/sclera: Conjunctivae normal.     Pupils: Pupils are equal, round, and reactive to light.  Cardiovascular:     Rate and Rhythm: Normal rate and regular rhythm.     Heart sounds: Normal heart sounds.  Pulmonary:     Effort: Pulmonary effort is normal. No respiratory distress.     Breath sounds: Normal breath sounds. No wheezing or rales.  Abdominal:     General: There is no distension.      Palpations: Abdomen is soft.  Musculoskeletal:        General: Normal range of motion.     Cervical back: Normal range of motion and neck supple.  Lymphadenopathy:     Cervical: No cervical adenopathy.  Skin:    General: Skin is warm and dry.  Neurological:     Mental Status: She is alert.  Psychiatric:        Mood and Affect: Mood normal.        Behavior: Behavior normal.     UC Treatments / Results  Labs (all labs ordered are listed, but only abnormal results are displayed) Labs Reviewed  NOVEL CORONAVIRUS, NAA    EKG   Radiology No results found.  Procedures Procedures (including critical care time)  Medications Ordered in UC Medications - No data to display  Initial Impression / Assessment and Plan / UC Course  I have reviewed the triage vital signs and the nursing notes.  Pertinent labs & imaging results that were available during my care of the patient were reviewed by me and considered in my medical decision making (see chart for details).     COVID testing was performed.  Instructions for home care reviewed.  Return as needed Final Clinical Impressions(s) / UC Diagnoses   Final diagnoses:  Flu-like symptoms     Discharge Instructions      Go home to rest Drink plenty of fluids Take Tylenol for pain or fever You may take over-the-counter cough and cold medicines as needed You must quarantine at home until your test result is available You can check for your test result in MyChart    ED Prescriptions   None    PDMP not reviewed this encounter.   , MD 06/11/21 [phone]

## 2021-06-11 NOTE — ED Triage Notes (Addendum)
Pt presents to Urgent Care with c/o bilateral ear pain/fullness, body aches, nausea and diarrhea, cough, and chills x 2 days. Has not done COVID test; vaccinated.

## 2021-06-11 NOTE — Discharge Instructions (Signed)
Go home to rest Drink plenty of fluids Take Tylenol for pain or fever You may take over-the-counter cough and cold medicines as needed You must quarantine at home until your test result is available You can check for your test result in MyChart  

## 2021-06-13 LAB — SARS-COV-2, NAA 2 DAY TAT

## 2021-06-13 LAB — NOVEL CORONAVIRUS, NAA: SARS-CoV-2, NAA: DETECTED — AB

## 2021-08-09 ENCOUNTER — Other Ambulatory Visit: Payer: Self-pay

## 2021-08-09 ENCOUNTER — Ambulatory Visit (INDEPENDENT_AMBULATORY_CARE_PROVIDER_SITE_OTHER): Payer: 59 | Admitting: Medical-Surgical

## 2021-08-09 ENCOUNTER — Encounter: Payer: Self-pay | Admitting: Medical-Surgical

## 2021-08-09 VITALS — BP 102/71 | HR 117 | Resp 20 | Ht 65.0 in | Wt 121.0 lb

## 2021-08-09 DIAGNOSIS — R519 Headache, unspecified: Secondary | ICD-10-CM | POA: Diagnosis not present

## 2021-08-09 DIAGNOSIS — N898 Other specified noninflammatory disorders of vagina: Secondary | ICD-10-CM | POA: Diagnosis not present

## 2021-08-09 DIAGNOSIS — R350 Frequency of micturition: Secondary | ICD-10-CM | POA: Diagnosis not present

## 2021-08-09 LAB — POCT URINALYSIS DIP (CLINITEK)
Bilirubin, UA: NEGATIVE
Blood, UA: NEGATIVE
Glucose, UA: NEGATIVE mg/dL
Ketones, POC UA: NEGATIVE mg/dL
Leukocytes, UA: NEGATIVE
Nitrite, UA: NEGATIVE
POC PROTEIN,UA: NEGATIVE
Spec Grav, UA: >=1.03 — AB (ref 1.010–1.025)
Urobilinogen, UA: 0.2 E.U./dL
pH, UA: 5.5 (ref 5.0–8.0)

## 2021-08-09 LAB — WET PREP FOR TRICH, YEAST, CLUE
MICRO NUMBER:: 12620502
Specimen Quality: ADEQUATE

## 2021-08-09 MED ORDER — METRONIDAZOLE 500 MG PO TABS: 500.0000 mg | ORAL_TABLET | Freq: Two times a day (BID) | ORAL | 0 refills | Status: AC

## 2021-08-09 NOTE — Progress Notes (Signed)
  HPI with pertinent ROS:   CC: migraine follow up  HPI: Pleasant 20 year old female presenting today for follow-up on migraines.  Approximately 6 months ago she was started on Topamax 50 mg daily.  She has been taking Topamax as prescribed, tolerating well without side effects.  Is working to find the best time to take it since she does work night shift.  She usually tries to take it between 2 AM and 3 AM its most of her headaches to in the early morning hours.  Has had a few headaches in the last month but none of them were as severe as they were prior to her starting the medication.  Notes 2 to 3 days of vaginal itching and irritation.  She has had some clumpy pink discharge but no significant vaginal odor.  Has an IUD in place and does have spotting regularly but no dyspareunia or other abnormal vaginal bleeding.  In a monogamous relationship with her partner and no concerns for STIs.  No fevers, chills, or pelvic pain.  I reviewed the past medical history, family history, social history, surgical history, and allergies today and no changes were needed.  Please see the problem list section below in epic for further details.   Physical exam:   General: Well Developed, well nourished, and in no acute distress.  Neuro: Alert and oriented x3.  HEENT: Normocephalic, atraumatic.  Skin: Warm and dry. Cardiac: Regular rate and rhythm, no murmurs rubs or gallops, no lower extremity edema.  Respiratory: Clear to auscultation bilaterally. Not using accessory muscles, speaking in full sentences.  Impression and Recommendations:    1. Chronic daily headache Continue Topamax 50 mg daily.  2. Urinary frequency POCT urinalysis negative for nitrites, leukocytes, blood, and protein.  Specific gravity elevated indicating likely need for better hydration. - POCT URINALYSIS DIP (CLINITEK)  3. Vaginal itching Stat wet prep positive for bacterial vaginosis.  Treating with oral metronidazole. - WET PREP  FOR TRICH, YEAST, CLUE  Return in about 6 months (around 02/06/2022) for Migraine follow-up. ___________________________________________ Thayer Ohm, DNP, APRN, FNP-BC Primary Care and Sports Medicine [facility]

## 2021-10-19 ENCOUNTER — Encounter: Payer: Self-pay | Admitting: Medical-Surgical

## 2021-10-22 ENCOUNTER — Ambulatory Visit: Payer: 59 | Admitting: Family Medicine

## 2021-10-22 ENCOUNTER — Other Ambulatory Visit: Payer: Self-pay

## 2021-10-22 ENCOUNTER — Encounter: Payer: Self-pay | Admitting: Family Medicine

## 2021-10-22 VITALS — BP 114/61 | HR 77 | Temp 97.5°F | Ht 65.0 in | Wt 115.0 lb

## 2021-10-22 DIAGNOSIS — N898 Other specified noninflammatory disorders of vagina: Secondary | ICD-10-CM | POA: Diagnosis not present

## 2021-10-22 DIAGNOSIS — Z202 Contact with and (suspected) exposure to infections with a predominantly sexual mode of transmission: Secondary | ICD-10-CM | POA: Diagnosis not present

## 2021-10-22 LAB — POCT URINALYSIS DIP (CLINITEK)
Bilirubin, UA: NEGATIVE
Blood, UA: NEGATIVE
Glucose, UA: NEGATIVE mg/dL
Ketones, POC UA: NEGATIVE mg/dL
Leukocytes, UA: NEGATIVE
Nitrite, UA: NEGATIVE
POC PROTEIN,UA: 30 — AB
Spec Grav, UA: >=1.03 — AB (ref 1.010–1.025)
Urobilinogen, UA: 0.2 E.U./dL
pH, UA: 6 (ref 5.0–8.0)

## 2021-10-22 NOTE — Assessment & Plan Note (Signed)
Sure swab collected today.  Urinalysis relatively unremarkable.  Also checking HIV antibody and RPR today.

## 2021-10-22 NOTE — Telephone Encounter (Signed)
Called x1 and no answer, no VM set up. Will attempt again later. AM

## 2021-10-22 NOTE — Telephone Encounter (Signed)
Called again, no answer & no VM box set up.

## 2021-10-22 NOTE — Progress Notes (Signed)
Diana Carpenter - 21 y.o. female MRN [id number]  Date of birth: 2000-10-06  Subjective Chief Complaint  Patient presents with   Diarrhea   Vaginal Itching    HPI Diana Carpenter is a 21 year old female here today with complaint of vaginal irritation and itching, vaginal discharge and some mild dysuria.  She does have a new sexual partner and would like to have STI testing.  She denies pelvic pain or pain with intercourse.  She has not had any significant vaginal bleeding, fever or chills.  ROS:  A comprehensive ROS was completed and negative except as noted per HPI  Allergies  Allergen Reactions   Other Rash    Tide products    Past Medical History:  Diagnosis Date   History of COVID-19    Mental disorder     Past Surgical History:  Procedure Laterality Date   TYMPANOSTOMY TUBE PLACEMENT     TYMPANOSTOMY TUBE PLACEMENT      Social History   Socioeconomic History   Marital status: Single    Spouse name: Not on file   Number of children: Not on file   Years of education: Not on file   Highest education level: Not on file  Occupational History   Not on file  Tobacco Use   Smoking status: Former    Types: Cigarettes   Smokeless tobacco: Never  Vaping Use   Vaping Use: Every day   Substances: Nicotine, Flavoring  Substance and Sexual Activity   Alcohol use: Not Currently   Drug use: Not Currently   Sexual activity: Not on file  Other Topics Concern   Not on file  Social History Narrative   Not on file   Social Determinants of Health   Financial Resource Strain: Not on file  Food Insecurity: Not on file  Transportation Needs: Not on file  Physical Activity: Not on file  Stress: Not on file  Social Connections: Not on file    Family History  Problem Relation Age of Onset   Hypertension Mother    Migraines Mother    Cirrhosis Father    Heart disease Father    Infertility Sister    Migraines Sister    Diabetes Maternal Grandfather    Migraines Maternal  Grandmother     Health Maintenance  Topic Date Due   HPV VACCINES (1 - 2-dose series) Never done   COVID-19 Vaccine (3 - Booster for Pfizer series) 01/25/2021   INFLUENZA VACCINE  12/28/2021 (Originally 04/30/2021)   TETANUS/TDAP  10/22/2022 (Originally 11/24/2017)   PAP-Cervical Cytology Screening  11/08/2023   PAP SMEAR-Modifier  11/08/2023   Hepatitis C Screening  Completed   HIV Screening  Completed     ----------------------------------------------------------------------------------------------------------------------------------------------------------------------------------------------------------------- Physical Exam BP 114/61 (BP Location: Left Arm, Patient Position: Sitting, Cuff Size: Small)    Pulse 77    Temp (!) 97.5 F (36.4 C)    Ht 5\' 5"  (1.651 m)    Wt 115 lb (52.2 kg)    SpO2 93%    BMI 19.14 kg/m   Physical Exam Exam conducted with a chaperone present , CMA).  Constitutional:      Appearance: Normal appearance.  Eyes:     General: No scleral icterus. Genitourinary:    General: Normal vulva.     Vagina: Vaginal discharge present. No bleeding.     Cervix: Normal.     Comments: IUD strings visible. Neurological:     Mental Status: She is alert.  Psychiatric:  Mood and Affect: Mood normal.        Behavior: Behavior normal.    ------------------------------------------------------------------------------------------------------------------------------------------------------------------------------------------------------------------- Assessment and Plan  Vaginal discharge Sure swab collected today.  Urinalysis relatively unremarkable.  Also checking HIV antibody and RPR today.   No orders of the defined types were placed in this encounter.   No follow-ups on file.    This visit occurred during the SARS-CoV-2 public health emergency.  Safety protocols were in place, including screening questions prior to the visit, additional  usage of staff PPE, and extensive cleaning of exam room while observing appropriate contact time as indicated for disinfecting solutions.

## 2021-10-22 NOTE — Patient Instructions (Signed)
We'll be in touch with lab results.  

## 2021-10-23 ENCOUNTER — Ambulatory Visit: Payer: 59 | Admitting: Family Medicine

## 2021-10-23 LAB — RPR: RPR Ser Ql: NONREACTIVE

## 2021-10-23 LAB — HIV ANTIBODY (ROUTINE TESTING W REFLEX): HIV 1&2 Ab, 4th Generation: NONREACTIVE

## 2021-10-25 ENCOUNTER — Encounter: Payer: Self-pay | Admitting: Family Medicine

## 2021-10-26 LAB — SURESWAB® ADVANCED VAGINITIS PLUS,TMA
C. trachomatis RNA, TMA: NOT DETECTED
CANDIDA SPECIES: NOT DETECTED
Candida glabrata: NOT DETECTED
N. gonorrhoeae RNA, TMA: NOT DETECTED
SURESWAB(R) ADV BACTERIAL VAGINOSIS(BV),TMA: POSITIVE — AB
TRICHOMONAS VAGINALIS (TV),TMA: NOT DETECTED

## 2021-10-26 MED ORDER — METRONIDAZOLE 500 MG PO TABS: 500.0000 mg | ORAL_TABLET | Freq: Two times a day (BID) | ORAL | 0 refills | Status: AC

## 2021-10-26 MED ORDER — METRONIDAZOLE 500 MG PO TABS: 500.0000 mg | ORAL_TABLET | Freq: Two times a day (BID) | ORAL | 0 refills | Status: DC

## 2022-02-05 NOTE — Progress Notes (Signed)
? ?Established Patient Office Visit ? ?Subjective   ?Patient ID: Diana Carpenter, female    DOB: 08/25/2001  Age: 21 y.o. MRN: [id number] ? ?Chief Complaint  ?Patient presents with  ? Follow-up  ? ?HPI ?Pleasant 21 year old female presenting today for the following: ? ?URI- started 2 weeks ago with general malaise and mild sinus symptoms.  Over the last 2 weeks, she also developed some worsening GI discomfort in the setting of known gastrointestinal issues.  On Saturday, she notes that her sinus congestion worsened drastically and now she is dealing with facial pressure, ear pressure/pain, popping in the ears, nasal drainage, and cough productive of thick greenish-brown mucus.  She has not had any documented fevers but has been experiencing chills.  Having some chest tightness as well as chest pressure with her coughing. ? ?Chronic daily headaches-taking Topamax 50 mg daily and has been on this for couple of years.  This works very well to help manage her chronic daily headaches and she has not had any problems until her recent illness.  Denies side effects from the medication. ? ? ?Review of Systems  ?Constitutional:  Positive for chills and malaise/fatigue. Negative for fever.  ?HENT:  Positive for congestion, ear pain, sinus pain and sore throat.   ?Respiratory:  Positive for cough, sputum production and shortness of breath.   ? ?  ?Objective:  ?  ? ?BP 103/68   Pulse 73   Resp 20   Ht 5\' 5"  (1.651 m)   Wt 119 lb 9.6 oz (54.3 kg)   SpO2 100%   BMI 19.90 kg/m?  ? ? ?Physical Exam ?Vitals reviewed.  ?Constitutional:   ?   General: She is not in acute distress. ?   Appearance: Normal appearance.  ?HENT:  ?   Head: Normocephalic and atraumatic.  ?   Right Ear: There is impacted cerumen.  ?   Left Ear: A middle ear effusion is present. There is no impacted cerumen. Tympanic membrane is erythematous.  ?   Nose: Congestion and rhinorrhea present.  ?   Mouth/Throat:  ?   Mouth: Mucous membranes are moist.  ?   Pharynx:  Posterior oropharyngeal erythema present. No oropharyngeal exudate.  ?Cardiovascular:  ?   Rate and Rhythm: Normal rate and regular rhythm.  ?   Pulses: Normal pulses.  ?   Heart sounds: Normal heart sounds. No murmur heard. ?  No friction rub. No gallop.  ?Pulmonary:  ?   Effort: Pulmonary effort is normal. No respiratory distress.  ?   Breath sounds: Normal breath sounds. No wheezing.  ?Musculoskeletal:  ?   Cervical back: Neck supple. No tenderness.  ?Lymphadenopathy:  ?   Cervical: Cervical adenopathy present.  ?Skin: ?   General: Skin is warm and dry.  ?Neurological:  ?   Mental Status: She is alert and oriented to person, place, and time.  ?Psychiatric:     ?   Mood and Affect: Mood normal.     ?   Behavior: Behavior normal.     ?   Thought Content: Thought content normal.     ?   Judgment: Judgment normal.  ? ?No results found for any visits on 02/06/22. ? ? ? ?The ASCVD Risk score (Arnett DK, et al., 2019) failed to calculate for the following reasons: ?  The 2019 ASCVD risk score is only valid for ages 70 to 40 ? ?  ?Assessment & Plan:  ? ?1. Chronic daily headache ?Stable.  Continue Topamax 50  mg daily.  Refill sent to pharmacy. ? ?2. Acute non-recurrent pansinusitis ?Treating with Augmentin twice daily x7 days.  Sending in Diflucan for vulvovaginal candidiasis prophylaxis.  Continue conservative measures at home.  Work note provided for time missed from work [employer]. ? ?Return in about 1 year (around 02/07/2023) for chronic headache management.  ?___________________________________________ ?Thayer Ohm, DNP, APRN, FNP-BC ?Primary Care and Sports Medicine ?[facility] ? ? ?

## 2022-02-06 ENCOUNTER — Ambulatory Visit: Payer: 59 | Admitting: Medical-Surgical

## 2022-02-06 ENCOUNTER — Encounter: Payer: Self-pay | Admitting: Medical-Surgical

## 2022-02-06 VITALS — BP 103/68 | HR 73 | Resp 20 | Ht 65.0 in | Wt 119.6 lb

## 2022-02-06 DIAGNOSIS — R519 Headache, unspecified: Secondary | ICD-10-CM | POA: Diagnosis not present

## 2022-02-06 DIAGNOSIS — J0140 Acute pansinusitis, unspecified: Secondary | ICD-10-CM

## 2022-02-06 MED ORDER — LEVOCETIRIZINE DIHYDROCHLORIDE 5 MG PO TABS: 5.0000 mg | ORAL_TABLET | Freq: Every evening | ORAL | 1 refills | Status: DC

## 2022-02-06 MED ORDER — TOPIRAMATE 50 MG PO TABS: 50.0000 mg | ORAL_TABLET | Freq: Every day | ORAL | 3 refills | Status: DC

## 2022-02-06 MED ORDER — AMOXICILLIN-POT CLAVULANATE 875-125 MG PO TABS: 1.0000 | ORAL_TABLET | Freq: Two times a day (BID) | ORAL | 0 refills | Status: AC

## 2022-02-06 MED ORDER — FLUCONAZOLE 150 MG PO TABS: 150.0000 mg | ORAL_TABLET | Freq: Once | ORAL | 0 refills | Status: AC

## 2022-05-09 IMAGING — US US PELVIS COMPLETE WITH TRANSVAGINAL
1 series · 14 of 25 positions shown · non-contrast
Comparison: None

CLINICAL DATA: Pelvic pain since IUD implanted on 02/23/2021. LMP
02/05/2021

EXAM:
TRANSABDOMINAL AND TRANSVAGINAL ULTRASOUND OF PELVIS
TECHNIQUE: Both transabdominal and transvaginal ultrasound examinations of the
pelvis were performed. Transabdominal technique was performed for
global imaging of the pelvis including uterus, ovaries, adnexal
regions, and pelvic cul-de-sac. It was necessary to proceed with
endovaginal exam following the transabdominal exam to visualize the
endometrium, IUD, and adnexa.

[Series 1: us pelvic complete with transvaginal · 81 acquisitions, 14 frames shown]
[im 1/81]
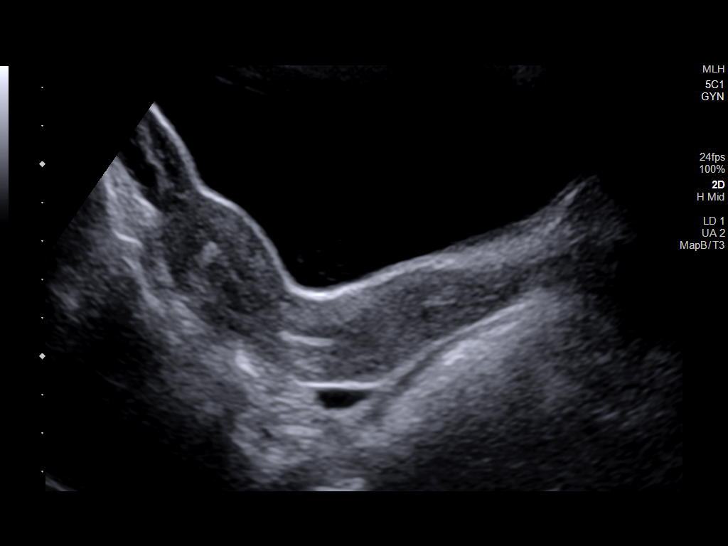
[im 7/81]
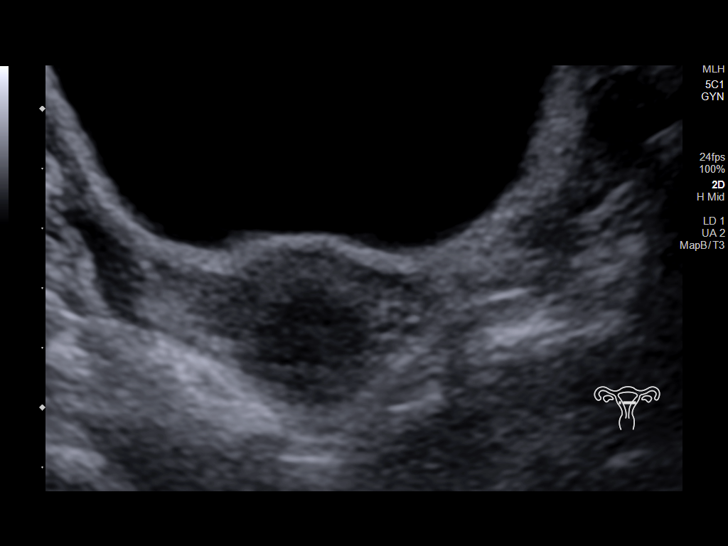
[im 14/81]
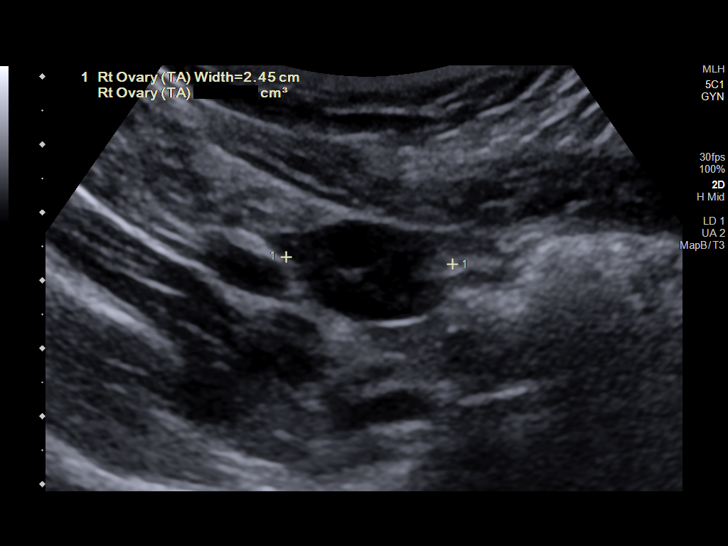
[im 21/81]
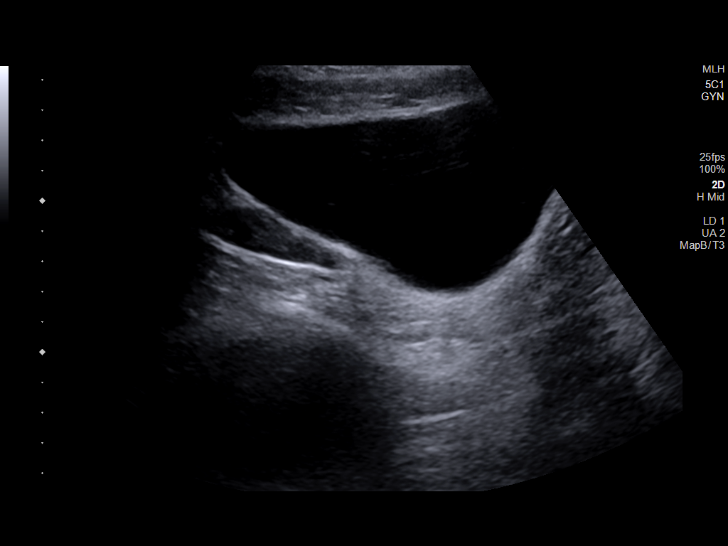
[im 27/81]
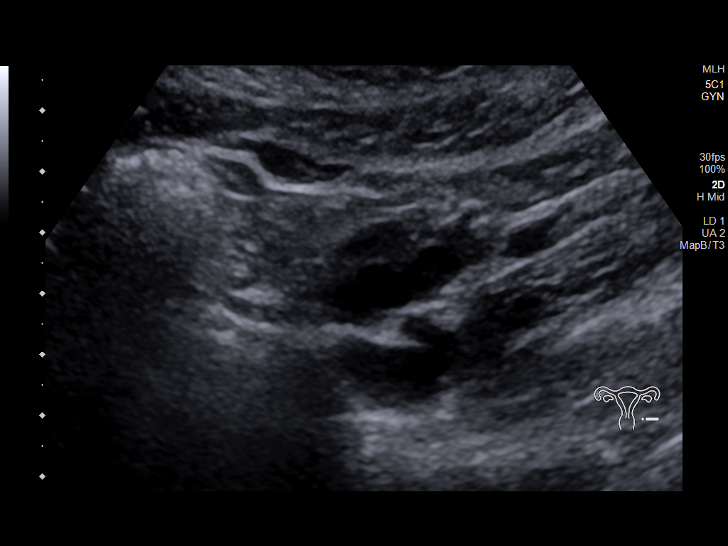
[im 31/81]
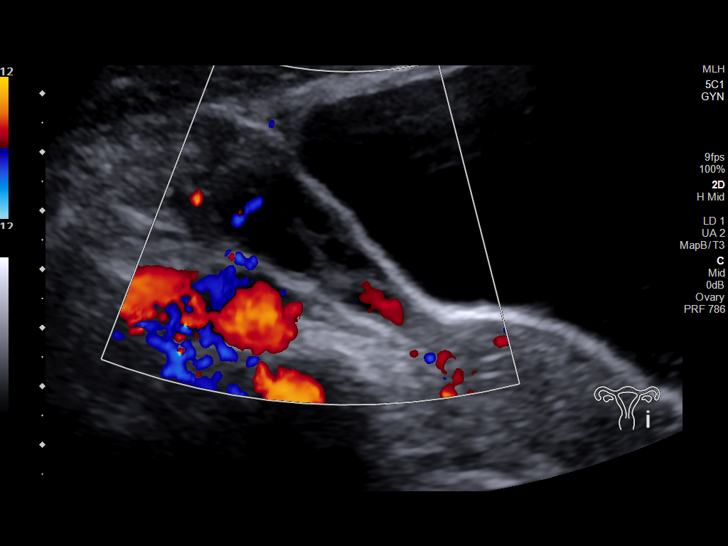
[im 37/81]
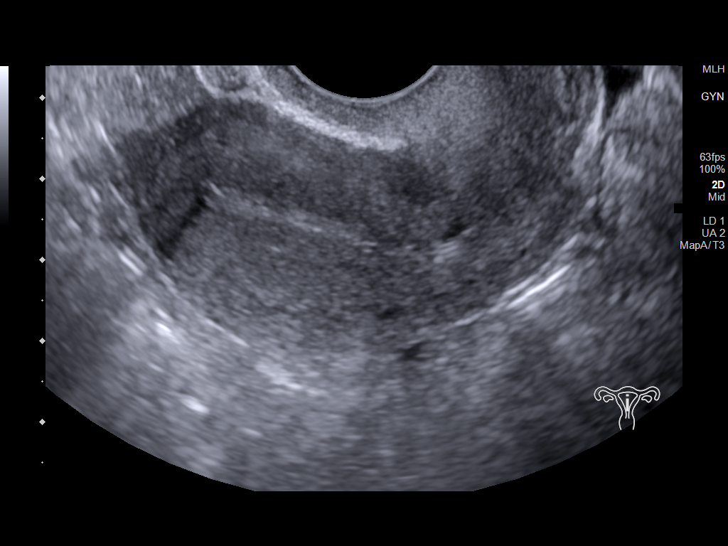
[im 44/81]
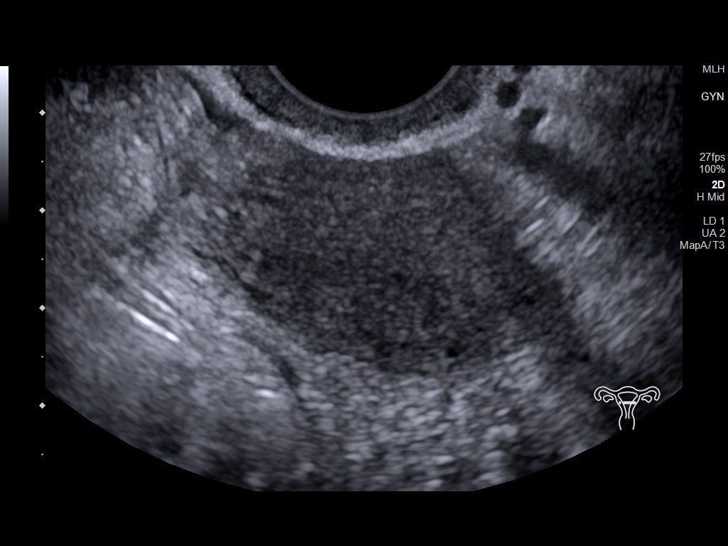
[im 51/81]
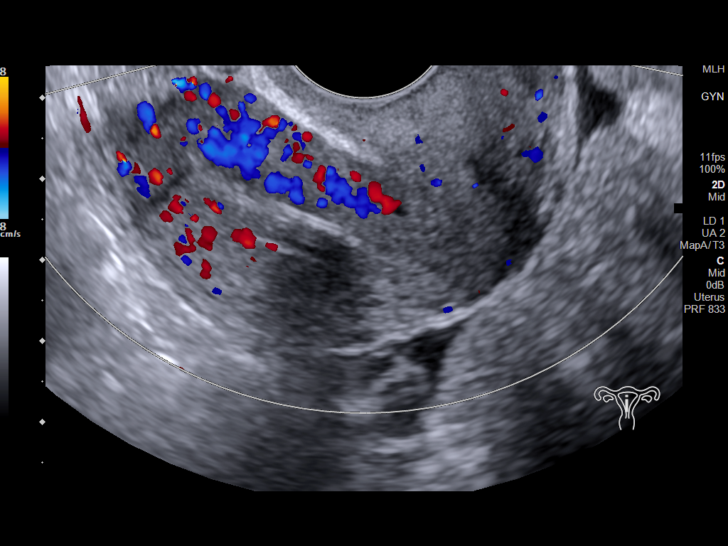
[im 54/81]
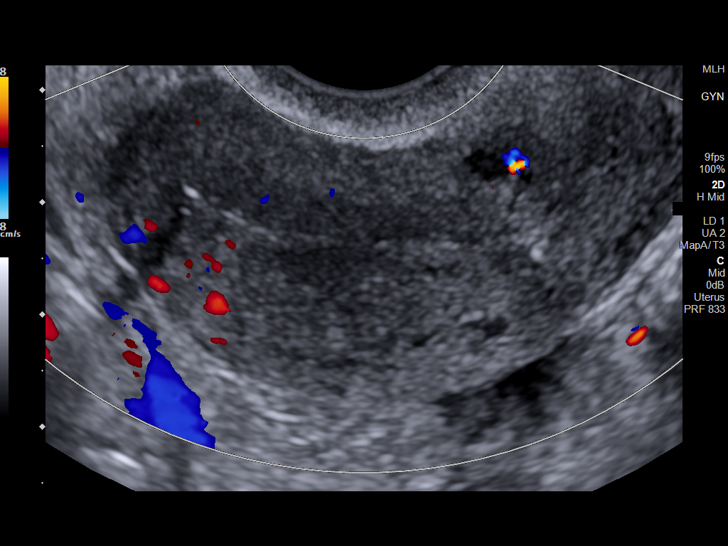
[im 61/81]
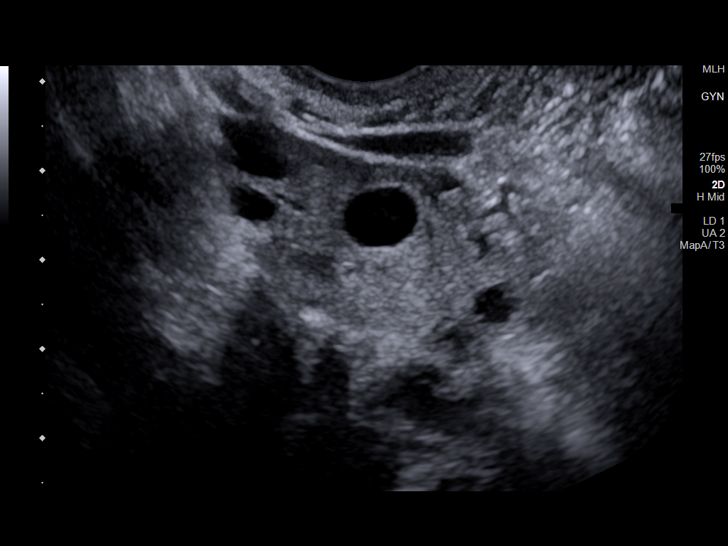
[im 67/81]
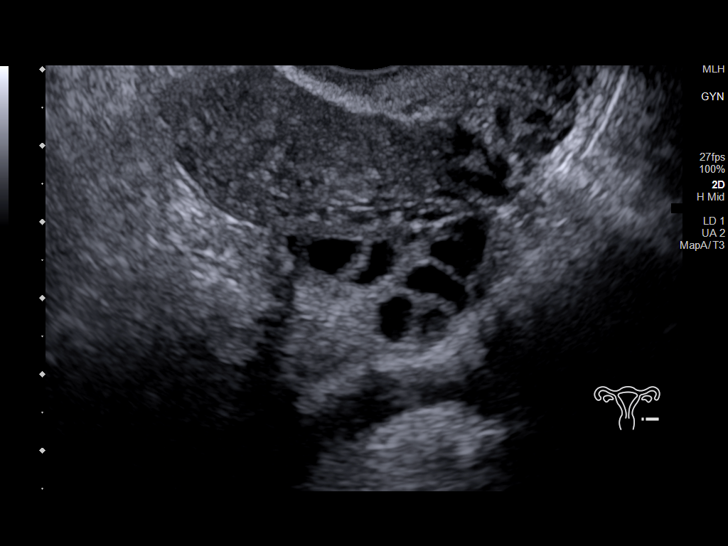
[im 74/81]
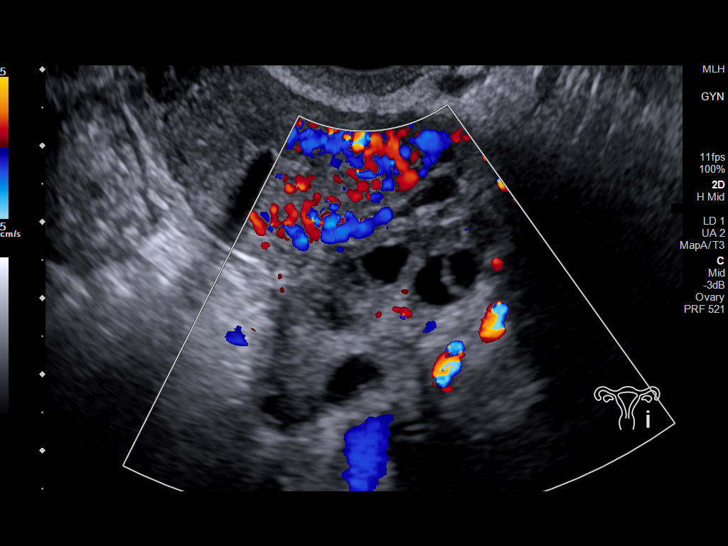
[im 81/81]
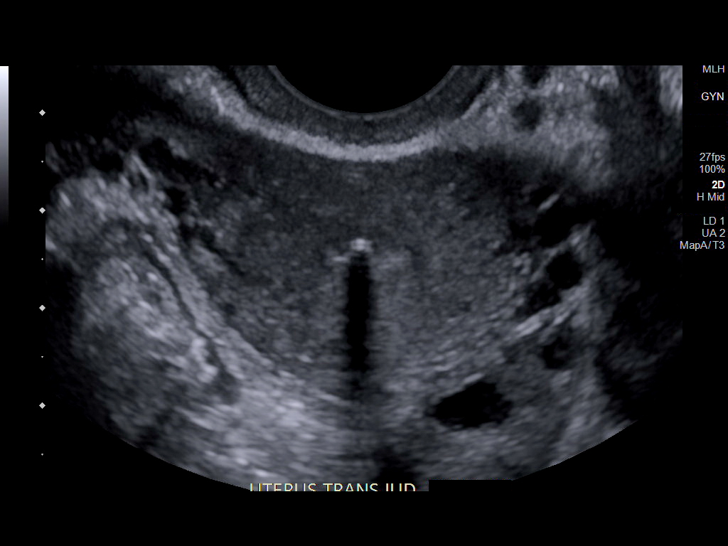

[14 of 25 positions shown; findings below may reference images not displayed]

FINDINGS: Uterus

Measurements: 7.3 x 2.7 x 3.5 cm = volume: 36 mL. Anteverted. Normal
morphology without mass

Endometrium

Thickness: 2 mm. IUD in expected position at upper uterine segment
endometrial canal. No endometrial fluid or mass

Right ovary

Measurements: 4.2 x 1.9 x 1.6 cm = volume: 6 mL. Normal morphology
without mass

Left ovary

Measurements: 4.1 x 1.9 x 2.7 cm = volume: 11 mL. Normal morphology
without mass

Other findings

Trace free pelvic fluid.  No adnexal masses.
IMPRESSION: IUD in expected position at upper uterine segment endometrial canal.

Otherwise negative exam.

## 2022-08-14 ENCOUNTER — Encounter: Payer: Self-pay | Admitting: Medical-Surgical

## 2022-08-14 NOTE — Telephone Encounter (Signed)
Attemped call to patient. Left a voice mail message requesting a return call to schedule an appt =kph

## 2022-08-14 NOTE — Telephone Encounter (Signed)
Needs appt

## 2022-08-15 ENCOUNTER — Ambulatory Visit (INDEPENDENT_AMBULATORY_CARE_PROVIDER_SITE_OTHER): Payer: 59 | Admitting: Family Medicine

## 2022-08-15 ENCOUNTER — Encounter: Payer: Self-pay | Admitting: Family Medicine

## 2022-08-15 VITALS — BP 104/73 | HR 72 | Ht 65.0 in | Wt 115.0 lb

## 2022-08-15 DIAGNOSIS — N760 Acute vaginitis: Secondary | ICD-10-CM

## 2022-08-15 DIAGNOSIS — Z23 Encounter for immunization: Secondary | ICD-10-CM | POA: Diagnosis not present

## 2022-08-15 DIAGNOSIS — N83202 Unspecified ovarian cyst, left side: Secondary | ICD-10-CM

## 2022-08-15 DIAGNOSIS — G8929 Other chronic pain: Secondary | ICD-10-CM

## 2022-08-15 DIAGNOSIS — R1030 Lower abdominal pain, unspecified: Secondary | ICD-10-CM

## 2022-08-15 DIAGNOSIS — M533 Sacrococcygeal disorders, not elsewhere classified: Secondary | ICD-10-CM | POA: Diagnosis not present

## 2022-08-15 NOTE — Telephone Encounter (Signed)
Patient seen in office today . 08/15/22 with Dr. Linford Arnold.
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
## 2022-08-16 LAB — WET PREP FOR TRICH, YEAST, CLUE
MICRO NUMBER:: 14205032
Specimen Quality: ADEQUATE

## 2022-08-16 MED ORDER — METRONIDAZOLE 500 MG PO TABS: 500.0000 mg | ORAL_TABLET | Freq: Two times a day (BID) | ORAL | 0 refills | Status: AC

## 2022-08-16 NOTE — Addendum Note (Signed)
Addended by: Nani Gasser D on: 08/16/2022 04:41 PM   Modules accepted: Orders

## 2022-08-16 NOTE — Progress Notes (Signed)
Diana Carpenter, wet prep did show overgrowth of bacteria.  I will send over a prescription called metronidazole to help clear that up.

## 2022-08-18 LAB — CBC WITH DIFFERENTIAL/PLATELET
Absolute Monocytes: 558 cells/uL (ref 200–950)
Basophils Absolute: 42 cells/uL (ref 0–200)
Basophils Relative: 0.7 %
Eosinophils Absolute: 90 cells/uL (ref 15–500)
Eosinophils Relative: 1.5 %
HCT: 41.3 % (ref 35.0–45.0)
Hemoglobin: 13.9 g/dL (ref 11.7–15.5)
Lymphs Abs: 1806 cells/uL (ref 850–3900)
MCH: 30.4 pg (ref 27.0–33.0)
MCHC: 33.7 g/dL (ref 32.0–36.0)
MCV: 90.4 fL (ref 80.0–100.0)
MPV: 9.2 fL (ref 7.5–12.5)
Monocytes Relative: 9.3 %
Neutro Abs: 3504 cells/uL (ref 1500–7800)
Neutrophils Relative %: 58.4 %
Platelets: 402 10*3/uL — ABNORMAL HIGH (ref 140–400)
RBC: 4.57 10*6/uL (ref 3.80–5.10)
RDW: 12.1 % (ref 11.0–15.0)
Total Lymphocyte: 30.1 %
WBC: 6 10*3/uL (ref 3.8–10.8)

## 2022-08-18 LAB — ANA: Anti Nuclear Antibody (ANA): NEGATIVE

## 2022-08-18 LAB — RHEUMATOID FACTOR: Rheumatoid fact SerPl-aCnc: <14 IU/mL (ref <14)

## 2022-08-18 LAB — CYCLIC CITRUL PEPTIDE ANTIBODY, IGG: Cyclic Citrullin Peptide Ab: <16 UNITS

## 2022-08-18 LAB — C-REACTIVE PROTEIN: CRP: 0.4 mg/L (ref <8.0)

## 2022-08-18 LAB — SEDIMENTATION RATE: Sed Rate: 2 mm/h (ref 0–20)
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
## 2022-09-06 ENCOUNTER — Ambulatory Visit: Payer: 59 | Admitting: Medical-Surgical

## 2022-09-17 ENCOUNTER — Encounter: Payer: Self-pay | Admitting: Medical-Surgical

## 2022-09-17 ENCOUNTER — Ambulatory Visit: Payer: 59 | Admitting: Medical-Surgical

## 2022-09-17 VITALS — BP 89/56 | HR 61 | Resp 20 | Ht 65.0 in | Wt 122.0 lb

## 2022-09-17 DIAGNOSIS — R300 Dysuria: Secondary | ICD-10-CM | POA: Diagnosis not present

## 2022-09-17 DIAGNOSIS — N898 Other specified noninflammatory disorders of vagina: Secondary | ICD-10-CM | POA: Diagnosis not present

## 2022-09-17 LAB — POCT URINALYSIS DIP (CLINITEK)
Bilirubin, UA: NEGATIVE
Blood, UA: NEGATIVE
Glucose, UA: NEGATIVE mg/dL
Ketones, POC UA: NEGATIVE mg/dL
Leukocytes, UA: NEGATIVE
Nitrite, UA: NEGATIVE
POC PROTEIN,UA: NEGATIVE
Spec Grav, UA: 1.025 (ref 1.010–1.025)
Urobilinogen, UA: 0.2 E.U./dL
pH, UA: 7 (ref 5.0–8.0)

## 2022-09-17 NOTE — Progress Notes (Signed)
Established Patient Office Visit  Subjective   Patient ID: Diana Carpenter, female   DOB: [birth year] Age: 21 y.o. MRN: [id number]   Chief Complaint  Patient presents with   Follow-up   bacterialvaginosis    HPI Pleasant 21 year old female presenting today for evaluation of vaginal irritation and dysuria.  Notes that she had BV a few weeks ago and was treated for this appropriately.  Since then she has developed vaginal itching and burning with yellow discharge that is described as thick and thin at the same time.  Occasionally has a fishy or onion like odor.  She is sexually active with 3 female partners.  They do not use condoms although she thinks that 1 partner has a different partner outside of her.  She has had no known exposure to sexually transmitted infections.  Notes that she has had some left-sided vaginal pain and previously had a small cyst in that area.  Would like [place] to double check and make sure that there is nothing else going on that needs further attention.  Objective:    Vitals:   09/17/22 1351  BP: (!) 89/56  Pulse: 61  Resp: 20  Height: 5\' 5"  (1.651 m)  Weight: 122 lb 0.6 oz (55.4 kg)  SpO2: 100%  BMI (Calculated): 20.31    Physical Exam Vitals reviewed. Exam conducted with a chaperone present.  Constitutional:      General: She is not in acute distress.    Appearance: Normal appearance. She is not ill-appearing.  HENT:     Head: Normocephalic and atraumatic.  Cardiovascular:     Rate and Rhythm: Normal rate and regular rhythm.     Pulses: Normal pulses.     Heart sounds: Normal heart sounds.  Pulmonary:     Effort: Pulmonary effort is normal. No respiratory distress.     Breath sounds: Normal breath sounds. No wheezing, rhonchi or rales.  Genitourinary:    Exam position: Lithotomy position.     Pubic Area: No rash.      Labia:        Right: No rash, tenderness, lesion or injury.        Left: No rash, tenderness, lesion or injury.      Vagina: Erythema  (Mild erythema at the vaginal introitus) present.     Comments: Small vaginal vault along with difficulty relaxing for the exam.  Unable to fully insert speculum for visualization of cervix.  Sure swab for vaginitis plus completed during exam. Skin:    General: Skin is warm and dry.  Neurological:     Mental Status: She is alert and oriented to person, place, and time.  Psychiatric:        Mood and Affect: Mood normal.        Behavior: Behavior normal.        Thought Content: Thought content normal.        Judgment: Judgment normal.    Results for orders placed or performed in visit on 09/17/22 (from the past 24 hour(s))  POCT URINALYSIS DIP (CLINITEK)     Status: None   Collection Time: 09/17/22  2:21 PM  Result Value Ref Range   Color, UA yellow yellow   Clarity, UA clear clear   Glucose, UA negative negative mg/dL   Bilirubin, UA negative negative   Ketones, POC UA negative negative mg/dL   Spec Grav, UA 1.025 1.010 - 1.025   Blood, UA negative negative   pH, UA 7.0 5.0 - 8.0  POC PROTEIN,UA negative negative, trace   Urobilinogen, UA 0.2 0.2 or 1.0 E.U./dL   Nitrite, UA Negative Negative   Leukocytes, UA Negative Negative       The ASCVD Risk score (Arnett DK, et al., 2019) failed to calculate for the following reasons:   The 2019 ASCVD risk score is only valid for ages 61 to 24   Assessment & Plan:   1. Burning with urination 2. Vaginal discharge POCT urinalysis negative.  Sure swab advanced plus sent for vaginitis evaluation. - POCT URINALYSIS DIP (CLINITEK) - SureSwab Advanced Bacterial Vaginosis (BV), CT/NG, TMA  Return if symptoms worsen or fail to improve.  ___________________________________________ Thayer Ohm, DNP, APRN, FNP-BC Primary Care and Sports Medicine [facility]

## 2022-09-18 LAB — SURESWAB® ADVANCED BACTERIAL VAGINOSIS (BV), CT/NG,TMA
C. trachomatis RNA, TMA: NOT DETECTED
N. gonorrhoeae RNA, TMA: NOT DETECTED
SURESWAB(R) ADV BACTERIAL VAGINOSIS(BV),TMA: NEGATIVE

## 2022-09-19 MED ORDER — FLUCONAZOLE 150 MG PO TABS: 150.0000 mg | ORAL_TABLET | Freq: Once | ORAL | 0 refills | Status: AC

## 2022-09-19 NOTE — Addendum Note (Signed)
Addended byChristen Butter on: 09/19/2022 07:25 AM   Modules accepted: Orders

## 2022-09-26 ENCOUNTER — Other Ambulatory Visit: Payer: Self-pay | Admitting: Medical-Surgical

## 2022-10-14 ENCOUNTER — Telehealth: Payer: Self-pay | Admitting: General Practice
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
## 2023-01-08 ENCOUNTER — Encounter: Payer: Self-pay | Admitting: Emergency Medicine

## 2023-01-08 ENCOUNTER — Ambulatory Visit
Admission: EM | Admit: 2023-01-08 | Discharge: 2023-01-08 | Disposition: A | Discharge status: Home / Self Care | Payer: 59 | Attending: Family Medicine | Admitting: Family Medicine

## 2023-01-08 DIAGNOSIS — R110 Nausea: Secondary | ICD-10-CM | POA: Insufficient documentation

## 2023-01-08 DIAGNOSIS — R300 Dysuria: Secondary | ICD-10-CM | POA: Diagnosis present

## 2023-01-08 DIAGNOSIS — N898 Other specified noninflammatory disorders of vagina: Secondary | ICD-10-CM | POA: Insufficient documentation

## 2023-01-08 LAB — POCT URINALYSIS DIP (MANUAL ENTRY)
Bilirubin, UA: NEGATIVE
Blood, UA: NEGATIVE
Glucose, UA: NEGATIVE mg/dL
Leukocytes, UA: NEGATIVE
Nitrite, UA: NEGATIVE
Protein Ur, POC: NEGATIVE mg/dL
Spec Grav, UA: 1.015 (ref 1.010–1.025)
Urobilinogen, UA: 0.2 E.U./dL
pH, UA: 5.5 (ref 5.0–8.0)

## 2023-01-08 MED ORDER — ONDANSETRON 4 MG PO TBDP: 4.0000 mg | ORAL_TABLET | Freq: Three times a day (TID) | ORAL | 0 refills | Status: DC | PRN

## 2023-01-08 MED ORDER — ONDANSETRON 4 MG PO TBDP
4.0000 mg | ORAL_TABLET | Freq: Once | ORAL | Status: AC
  Administered 2023-01-08: 4 mg via ORAL

## 2023-01-08 MED ORDER — FLUCONAZOLE 150 MG PO TABS: ORAL_TABLET | ORAL | 0 refills | Status: DC

## 2023-01-08 NOTE — ED Provider Notes (Signed)
D[facility]    CSN: [id number] Arrival date & time: 01/08/23  1659      History   Chief Complaint Chief Complaint  Patient presents with   Flank Pain    HPI Diana Carpenter is a 22 y.o. female.   HPI 22 year old female presents with flank pain, nausea, vaginal itching and dysuria for 1 week.  Patient reports that she knows she has an ovarian cyst and states pain is getting worse.  PMH significant for mental health disorder, ASCUS of cervix with negative high risk HPV, and IUD in place.  Past Medical History:  Diagnosis Date   History of COVID-19    Mental disorder     Patient Active Problem List   Diagnosis Date Noted   ASCUS of cervix with negative high risk HPV 02/27/2021   IUD (intrauterine device) in place 02/27/2021    Past Surgical History:  Procedure Laterality Date   TYMPANOSTOMY TUBE PLACEMENT     TYMPANOSTOMY TUBE PLACEMENT      OB History     Gravida  0   Para  0   Term  0   Preterm  0   AB  0   Living  0      SAB  0   IAB  0   Ectopic  0   Multiple  0   Live Births  0            Home Medications    Prior to Admission medications   Medication Sig Start Date End Date Taking? Authorizing Provider  fluconazole (DIFLUCAN) 150 MG tablet Take 1 tab p.o. for vaginal candidiasis, may repeat 1 tab p.o. in 3 days if symptoms are not resolved. 01/08/23  Yes Trevor Iha, FNP  ondansetron (ZOFRAN-ODT) 4 MG disintegrating tablet Take 1 tablet (4 mg total) by mouth every 8 (eight) hours as needed for nausea or vomiting. 01/08/23  Yes Trevor Iha, FNP  topiramate (TOPAMAX) 50 MG tablet Take 50 mg by mouth daily. 10/28/22  Yes [provider]    Family History Family History  Problem Relation Age of Onset   Hypertension Mother    Migraines Mother    Cirrhosis Father    Heart disease Father    Infertility Sister    Migraines Sister    Diabetes Maternal Grandfather    Migraines Maternal Grandmother     Social  History Social History   Tobacco Use   Smoking status: Former    Types: Cigarettes   Smokeless tobacco: Never  Vaping Use   Vaping Use: Every day   Substances: Nicotine, Flavoring  Substance Use Topics   Alcohol use: Not Currently    Alcohol/week: 2.0 standard drinks of alcohol    Types: 2 Shots of liquor per week    Comment: 2 daily   Drug use: Yes    Types: Marijuana     Allergies   Other   Review of Systems Review of Systems  Gastrointestinal:  Positive for nausea.  Genitourinary:  Positive for dysuria and flank pain.       Vaginal itching  All other systems reviewed and are negative.    Physical Exam Triage Vital Signs ED Triage Vitals  Enc Vitals Group     BP 01/08/23 1719 110/77     Pulse Rate 01/08/23 1719 74     Resp 01/08/23 1719 16     Temp 01/08/23 1719 97.6 F (36.4 C)     Temp Source 01/08/23 1719 Oral  SpO2 01/08/23 1719 100 %     Weight --      Height --      Head Circumference --      Peak Flow --      Pain Score 01/08/23 1721 5     Pain Loc --      Pain Edu? --      Excl. in GC? --    No data found.  Updated Vital Signs BP 110/77 (BP Location: Right Arm)   Pulse 74   Temp 97.6 F (36.4 C) (Oral)   Resp 16   SpO2 100%      Physical Exam Vitals and nursing note reviewed.  Constitutional:      Appearance: Normal appearance. She is normal weight.  HENT:     Head: Normocephalic and atraumatic.     Nose: Nose normal.     Mouth/Throat:     Mouth: Mucous membranes are moist.     Pharynx: Oropharynx is clear.  Eyes:     Extraocular Movements: Extraocular movements intact.     Conjunctiva/sclera: Conjunctivae normal.     Pupils: Pupils are equal, round, and reactive to light.  Cardiovascular:     Rate and Rhythm: Normal rate and regular rhythm.     Pulses: Normal pulses.     Heart sounds: Normal heart sounds.  Pulmonary:     Effort: Pulmonary effort is normal.     Breath sounds: Normal breath sounds. No wheezing, rhonchi  or rales.  Abdominal:     Tenderness: There is no right CVA tenderness or left CVA tenderness.  Musculoskeletal:        General: Normal range of motion.     Cervical back: Normal range of motion and neck supple.  Skin:    General: Skin is warm and dry.  Neurological:     General: No focal deficit present.     Mental Status: She is alert and oriented to person, place, and time. Mental status is at baseline.      UC Treatments / Results  Labs (all labs ordered are listed, but only abnormal results are displayed) Labs Reviewed  POCT URINALYSIS DIP (MANUAL ENTRY) - Abnormal; Notable for the following components:      Result Value   Ketones, POC UA small (15) (*)    All other components within normal limits  CERVICOVAGINAL ANCILLARY ONLY    EKG   Radiology No results found.  Procedures Procedures (including critical care time)  Medications Ordered in UC Medications  ondansetron (ZOFRAN-ODT) disintegrating tablet 4 mg (4 mg Oral Given 01/08/23 1824)    Initial Impression / Assessment and Plan / UC Course  I have reviewed the triage vital signs and the nursing notes.  Pertinent labs & imaging results that were available during my care of the patient were reviewed by me and considered in my medical decision making (see chart for details).     MDM: 1.  Dysuria-UA revealed above unable to run urine culture; 2.  Vaginal itching-Rx'd Diflucan, Aptima swab ordered; 3.  Nausea-Zofran 4 mg given once in clinic Rx'd Zofran 4 mg every 8 hours, as needed for nausea. Advised patient to take medication as directed.  Advised patient may repeat Diflucan in 3 days if symptoms have not resolved.  Advised we will follow-up with Aptima swab results once received.  Encourage patient increase daily water intake to 64 ounces per day while taking this medication. Advised if symptoms worsen and/or unresolved please follow-up PCP or here  for further evaluation.  Discharged home, hemodynamically stable.   Work note provided to patient prior to discharge. Final Clinical Impressions(s) / UC Diagnoses   Final diagnoses:  Dysuria  Vaginal itching  Nausea     Discharge Instructions      Advised patient to take medication as directed.  Advised patient may repeat Diflucan in 3 days if symptoms have not resolved.  Advised we will follow-up with Aptima swab results once received.  Encourage patient increase daily water intake to 64 ounces per day while taking this medication. Advised if symptoms worsen and/or unresolved please follow-up PCP or here for further evaluation.      ED Prescriptions     Medication Sig Dispense Auth. Provider   fluconazole (DIFLUCAN) 150 MG tablet Take 1 tab p.o. for vaginal candidiasis, may repeat 1 tab p.o. in 3 days if symptoms are not resolved. 7 tablet Trevor Ihaagan, Lillar Bianca, FNP   ondansetron (ZOFRAN-ODT) 4 MG disintegrating tablet Take 1 tablet (4 mg total) by mouth every 8 (eight) hours as needed for nausea or vomiting. 24 tablet Trevor Ihaagan, Jaiona Simien, FNP      PDMP not reviewed this encounter.   Trevor IhaRagan, Eliaz Fout, FNP 01/08/23 1829

## 2023-01-08 NOTE — ED Triage Notes (Signed)
Pt c/o flank pain and dysuria for the last week. She knows she has an ovarian cyst so thought that was the cause but states pain is getting worse. Yesterday she developed some diarrhea.

## 2023-01-08 NOTE — Discharge Instructions (Addendum)
Advised patient to take medication as directed.  Advised patient may repeat Diflucan in 3 days if symptoms have not resolved.  Advised we will follow-up with Aptima swab results once received.  Encourage patient increase daily water intake to 64 ounces per day while taking this medication. Advised if symptoms worsen and/or unresolved please follow-up PCP or here for further evaluation.

## 2023-01-10 LAB — CERVICOVAGINAL ANCILLARY ONLY
Bacterial Vaginitis (gardnerella): NEGATIVE
Candida Glabrata: NEGATIVE
Candida Vaginitis: NEGATIVE
Chlamydia: NEGATIVE
Comment: NEGATIVE
Comment: NEGATIVE
Comment: NEGATIVE
Comment: NEGATIVE
Comment: NEGATIVE
Comment: NORMAL
Neisseria Gonorrhea: NEGATIVE
Trichomonas: NEGATIVE

## 2023-02-05 ENCOUNTER — Ambulatory Visit: Payer: 59 | Admitting: Medical-Surgical

## 2023-02-07 ENCOUNTER — Ambulatory Visit: Payer: 59 | Admitting: Medical-Surgical

## 2023-02-11 ENCOUNTER — Encounter: Payer: 59 | Admitting: Medical-Surgical

## 2023-02-11 ENCOUNTER — Telehealth: Payer: Self-pay | Admitting: Medical-Surgical

## 2023-02-11 NOTE — Telephone Encounter (Signed)
Cletis called at 9:44. She apologized many times. She states that her scheduled is all over the place and she is also helping her grandmother move. Should I cancel her appointment?

## 2023-02-11 NOTE — Progress Notes (Unsigned)
        Established patient visit  History, exam, impression, and plan:  Primary female hypogonadism Up-to-date testosterone labs.  Due for his next testosterone injection.  Doing well on his current regimen with stable vitals.  Testosterone cypionate 100 mg IM given x 1 in the office.  Due for next shot in 2 weeks.  Lump of skin of lower extremity, left Noted a lump on the left posterior calf approximately 1 month ago.  Initially, had redness, tenderness, swelling at the area however this has fully resolved.  No longer has any residual symptoms but the lump has remained.  His wife noticed it and urged him to be seen to evaluate it.  History notable for varicose veins, compliant with recommendations for compression socks daily.  No recent injury or trauma to the area.  Unclear etiology.  The lump is approximately 1 cm x 1.5 cm and slightly discolored.  See clinical photo.  Plan to get vascular ultrasound as it does seem to be connected to one of the varicose veins that runs through the area.  Suspect superficial thrombophlebitis initially.  Discussed conservative measures with Tylenol, heat, ice, and compression socks.    Procedures performed this visit: None.  Return in about 2 weeks (around 01/08/2023) for nurse visit for testosterone shot.  __________________________________ Arora Coakley L. Kharter Sestak, DNP, APRN, FNP-BC Primary Care and Sports Medicine [facility] [place]  

## 2023-02-17 ENCOUNTER — Other Ambulatory Visit: Payer: Self-pay | Admitting: Medical-Surgical

## 2023-02-17 NOTE — Telephone Encounter (Signed)
Historical Provider

## 2023-03-03 ENCOUNTER — Other Ambulatory Visit: Payer: Self-pay | Admitting: Medical-Surgical

## 2023-03-03 ENCOUNTER — Ambulatory Visit: Payer: 59 | Admitting: Medical-Surgical

## 2023-05-07 ENCOUNTER — Other Ambulatory Visit: Payer: Self-pay | Admitting: Medical-Surgical

## 2023-05-29 ENCOUNTER — Other Ambulatory Visit: Payer: Self-pay | Admitting: Medical-Surgical

## 2023-06-13 ENCOUNTER — Encounter: Payer: Self-pay | Admitting: Medical-Surgical

## 2023-06-13 ENCOUNTER — Ambulatory Visit (INDEPENDENT_AMBULATORY_CARE_PROVIDER_SITE_OTHER): Payer: 59 | Admitting: Medical-Surgical

## 2023-06-13 VITALS — BP 100/67 | HR 73 | Resp 20 | Ht 65.0 in | Wt 125.0 lb

## 2023-06-13 DIAGNOSIS — Z113 Encounter for screening for infections with a predominantly sexual mode of transmission: Secondary | ICD-10-CM

## 2023-06-13 DIAGNOSIS — R300 Dysuria: Secondary | ICD-10-CM

## 2023-06-13 DIAGNOSIS — R519 Headache, unspecified: Secondary | ICD-10-CM

## 2023-06-13 DIAGNOSIS — R5383 Other fatigue: Secondary | ICD-10-CM

## 2023-06-13 LAB — POCT URINALYSIS DIP (CLINITEK)
Bilirubin, UA: NEGATIVE
Blood, UA: NEGATIVE
Glucose, UA: NEGATIVE mg/dL
Ketones, POC UA: NEGATIVE mg/dL
Leukocytes, UA: NEGATIVE
Nitrite, UA: NEGATIVE
POC PROTEIN,UA: NEGATIVE
Spec Grav, UA: >=1.03 — AB (ref 1.010–1.025)
Urobilinogen, UA: 0.2 U/dL
pH, UA: 6 (ref 5.0–8.0)

## 2023-06-13 MED ORDER — TOPIRAMATE 50 MG PO TABS: 50.0000 mg | ORAL_TABLET | Freq: Two times a day (BID) | ORAL | 1 refills | Status: DC

## 2023-06-13 MED ORDER — CLOTRIMAZOLE-BETAMETHASONE 1-0.05 % EX CREA: 1.0000 | TOPICAL_CREAM | Freq: Two times a day (BID) | CUTANEOUS | 0 refills | Status: AC

## 2023-06-13 NOTE — Progress Notes (Signed)
        Established patient visit  History, exam, impression, and plan:  1. Burning with urination Pleasant 22 year old female presenting today with reports of 1 month of issues with urinary urgency, frequency, and incomplete bladder emptying.  Has noticed that she has a copper/metal smell in the vaginal area and has not been feeling well overall.  This has been a recurrent issue in the past.  Notes sharp stabbing pelvic pain at times and is now having bright red heavy menstrual bleeding despite the presence of IUD.  She is sexually active with 1 female partners and they do not use condoms.  Denies fever, chills, nausea, vomiting, and flank pain.  POCT urinalysis normal with the exception of elevated specific gravity.  We will go ahead and send this for culture for further evaluation.  Wet prep completed today to evaluate for BV/yeast.  Plan for full STI testing as noted below.  Discussed possible bladder pain syndrome versus infectious process.  She does have quite a bit of itching and irritation to the external vaginal areas which could be contributing to her symptoms.  Adding Lotrisone twice daily for up to 14 days as needed while awaiting results.  2. Chronic daily headache Has been taking Topamax 50 mg daily, tolerating well without side effects.  Unfortunately she is still having headaches on a regular basis.  Has tried adding Excedrin Migraine but has not seen much benefit.  She is interested in increasing her dose of Topamax to see if this is beneficial.  Increase Topamax to 50 mg twice daily and plan to touch base with me via MyChart message in approximately 4 weeks.  3. Fatigue, unspecified type Has been feeling weak and fatigued lately for no known reason.  She is currently on tramadol and tizanidine which could certainly make her feel fatigued and weak however plan for blood work as below to evaluate for metabolic cause. - TSH - Iron, TIBC and Ferritin Panel - CMP14+EGFR - CBC with  Differential/Platelet  4. Routine screening for STI (sexually transmitted infection) STI screening as below. - RPR - Hepatitis B surface antigen - Hepatitis C antibody - HIV Antibody (routine testing w rflx) - Chlamydia/Gonococcus/Trichomonas, NAA  Procedures performed this visit: None.  Return if symptoms worsen or fail to improve.  __________________________________ Thayer Ohm, DNP, APRN, FNP-BC Primary Care and Sports Medicine [facility] [place]

## 2023-06-14 LAB — CBC WITH DIFFERENTIAL/PLATELET
Basophils Absolute: 0 10*3/uL (ref 0.0–0.2)
Basos: 1 %
EOS (ABSOLUTE): 0.1 10*3/uL (ref 0.0–0.4)
Eos: 1 %
Hematocrit: 39.7 % (ref 34.0–46.6)
Hemoglobin: 13.1 g/dL (ref 11.1–15.9)
Immature Grans (Abs): 0 10*3/uL (ref 0.0–0.1)
Immature Granulocytes: 0 %
Lymphocytes Absolute: 1.5 10*3/uL (ref 0.7–3.1)
Lymphs: 20 %
MCH: 30.8 pg (ref 26.6–33.0)
MCHC: 33 g/dL (ref 31.5–35.7)
MCV: 93 fL (ref 79–97)
Monocytes Absolute: 0.6 10*3/uL (ref 0.1–0.9)
Monocytes: 8 %
Neutrophils Absolute: 4.9 10*3/uL (ref 1.4–7.0)
Neutrophils: 70 %
Platelets: 321 10*3/uL (ref 150–450)
RBC: 4.25 x10E6/uL (ref 3.77–5.28)
RDW: 11.7 % (ref 11.7–15.4)
WBC: 7.1 10*3/uL (ref 3.4–10.8)

## 2023-06-14 LAB — RPR: RPR Ser Ql: NONREACTIVE

## 2023-06-14 LAB — IRON,TIBC AND FERRITIN PANEL
Ferritin: 87 ng/mL (ref 15–150)
Iron Saturation: 15 % (ref 15–55)
Iron: 49 ug/dL (ref 27–159)
Total Iron Binding Capacity: 335 ug/dL (ref 250–450)
UIBC: 286 ug/dL (ref 131–425)

## 2023-06-14 LAB — CMP14+EGFR
ALT: 17 IU/L (ref 0–32)
AST: 20 IU/L (ref 0–40)
Albumin: 4.2 g/dL (ref 4.0–5.0)
Alkaline Phosphatase: 61 IU/L (ref 44–121)
BUN/Creatinine Ratio: 21 (ref 9–23)
BUN: 12 mg/dL (ref 6–20)
Bilirubin Total: 0.2 mg/dL (ref 0.0–1.2)
CO2: 21 mmol/L (ref 20–29)
Calcium: 9.4 mg/dL (ref 8.7–10.2)
Chloride: 104 mmol/L (ref 96–106)
Creatinine, Ser: 0.58 mg/dL (ref 0.57–1.00)
Globulin, Total: 2.2 g/dL (ref 1.5–4.5)
Glucose: 94 mg/dL (ref 70–99)
Potassium: 4.4 mmol/L (ref 3.5–5.2)
Sodium: 138 mmol/L (ref 134–144)
Total Protein: 6.4 g/dL (ref 6.0–8.5)
eGFR: 130 mL/min/{1.73_m2} (ref >59)

## 2023-06-14 LAB — HEPATITIS C ANTIBODY: Hep C Virus Ab: NONREACTIVE

## 2023-06-14 LAB — HIV ANTIBODY (ROUTINE TESTING W REFLEX): HIV Screen 4th Generation wRfx: NONREACTIVE

## 2023-06-14 LAB — HEPATITIS B SURFACE ANTIGEN: Hepatitis B Surface Ag: NEGATIVE

## 2023-06-14 LAB — TSH: TSH: 2.8 u[IU]/mL (ref 0.450–4.500)

## 2023-06-16 ENCOUNTER — Telehealth: Payer: Self-pay

## 2023-06-16 NOTE — Telephone Encounter (Signed)
The urine sample and wet prep did not make it to the lab in time for testing. If patient is still symptomatic we will need to recollect. I called and left a message asking for a return call.

## 2023-06-30 ENCOUNTER — Telehealth: Payer: Self-pay | Admitting: Medical-Surgical

## 2023-06-30 NOTE — Telephone Encounter (Signed)
Pt is having reconstructive ankle surgery within the next week. Wanted to advise you that muscle relaxer and pain killers prescribed by surgeon could cause blood clots Surgeon wanted to inform due to pt taking migraine medication. Any questions or concerns please call.

## 2024-02-03 ENCOUNTER — Other Ambulatory Visit: Payer: Self-pay | Admitting: Medical-Surgical

## 2024-03-23 ENCOUNTER — Ambulatory Visit: Admitting: Medical-Surgical

## 2024-03-25 ENCOUNTER — Telehealth: Payer: Self-pay | Admitting: *Deleted

## 2024-03-25 NOTE — Telephone Encounter (Signed)
 Returned call from 11:01 AM. Left patient a detailed message about scheduling a New GYN Physical due to last appointment being on 03/23/2021.

## 2024-03-26 ENCOUNTER — Ambulatory Visit (INDEPENDENT_AMBULATORY_CARE_PROVIDER_SITE_OTHER): Admitting: Urgent Care

## 2024-03-26 VITALS — BP 86/55 | HR 70 | Ht 65.0 in | Wt 136.2 lb

## 2024-03-26 DIAGNOSIS — R350 Frequency of micturition: Secondary | ICD-10-CM

## 2024-03-26 DIAGNOSIS — N898 Other specified noninflammatory disorders of vagina: Secondary | ICD-10-CM | POA: Diagnosis not present

## 2024-03-26 DIAGNOSIS — G43909 Migraine, unspecified, not intractable, without status migrainosus: Secondary | ICD-10-CM

## 2024-03-26 LAB — POCT URINALYSIS DIP (CLINITEK)
Bilirubin, UA: NEGATIVE
Blood, UA: NEGATIVE
Glucose, UA: NEGATIVE mg/dL
Ketones, POC UA: NEGATIVE mg/dL
Nitrite, UA: NEGATIVE
POC PROTEIN,UA: NEGATIVE
Spec Grav, UA: 1.015 (ref 1.010–1.025)
Urobilinogen, UA: 1 U/dL
pH, UA: 7 (ref 5.0–8.0)

## 2024-03-26 MED ORDER — CEPHALEXIN 500 MG PO CAPS: 500.0000 mg | ORAL_CAPSULE | Freq: Two times a day (BID) | ORAL | 0 refills | Status: AC

## 2024-03-26 MED ORDER — TOPIRAMATE 50 MG PO TABS: 50.0000 mg | ORAL_TABLET | Freq: Two times a day (BID) | ORAL | 1 refills | Status: DC

## 2024-03-26 NOTE — Progress Notes (Unsigned)
 Established Patient Office Visit  Subjective:  Patient ID: Diana Carpenter, female    DOB: Jan 21, 2001  Age: 23 y.o. MRN: [id number]  Chief Complaint  Patient presents with   Dysuria    Patient c/o  lower mid back pain and downward on back - burning with urination,  frequent urination, lower abdominal pain, urine odor and cloudy and vaginal itching , x 3 months coming and going. Symptoms somewhat better x yesterday.    Medication Refill    Medication refill of Topiramate  50mg      HPI  Discussed the use of AI scribe software for clinical note transcription with the patient, who gave verbal consent to proceed.  History of Present Illness   Diana Carpenter is a 23 year old female who presents with urinary symptoms persisting for three months.  For the past three months, she has experienced intermittent urinary symptoms including dysuria, a sensation of incomplete bladder emptying, and urinary frequency. Symptoms vary in intensity, with some days being asymptomatic and others causing significant discomfort, requiring up to 20 minutes to void small amounts of urine. The pain is severe and sporadic, with no identifiable triggers or changes in routine, diet, or fluid intake. She notes cloudy urine with an odor.  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 Despite this, she has been previously told she was dehydrated. She denies a history of nephrolithiasis but notes her brother has had kidney stones. She experiences sharp flank and upper back pain affecting the entire back. No fever is present, but she reports persistent nausea, not limited to days with urinary symptoms.  She underwent ankle surgery on October 25th and is scheduled for another surgery on July 3rd. Her urinary symptoms did not correlate with the date of her first surgery. She has taken AZO over-the-counter for her symptoms a couple of weeks  ago but has not used any other treatments.  Her past medical history includes migraines managed with Topamax  (topiramate ) daily, with a dose increase from 25 mg to 50 mg last year. She also has a history of a small ovarian cyst.  In terms of social history, she has been consuming alcohol since March and drinks coffee, soda, and tea sporadically. She mentions that topiramate  causes a bitter taste, affecting her consumption of these beverages.  During the review of symptoms, she reports no vaginal discharge or itching, although she mentions a white discharge. No known allergies to antibiotics, but she notes amoxicillin  is no longer effective for her.      Patient Active Problem List   Diagnosis Date Noted   ASCUS of cervix with negative high risk HPV 02/27/2021   IUD (intrauterine device) in place 02/27/2021   Past Medical History:  Diagnosis Date   History of COVID-19    Mental disorder    Past Surgical History:  Procedure Laterality Date   TYMPANOSTOMY TUBE PLACEMENT     TYMPANOSTOMY TUBE PLACEMENT     Social History   Tobacco Use   Smoking status: Former    Types: Cigarettes   Smokeless tobacco: Never  Vaping Use   Vaping status: Every Day   Substances: Nicotine, Flavoring  Substance Use Topics   Alcohol use: Not Currently    Alcohol/week: 2.0 standard drinks of alcohol    Types: 2 Shots of liquor per week    Comment: 2 daily   Drug use: Yes    Types: Marijuana  ROS: as noted in HPI  Objective:     BP (!) 86/55   Pulse 70   Ht 5' 5 (1.651 m)   Wt 136 lb 4 oz (61.8 kg)   SpO2 100%   BMI 22.67 kg/m  BP Readings from Last 3 Encounters:  03/26/24 (!) 86/55  06/13/23 100/67  01/08/23 110/77   Wt Readings from Last 3 Encounters:  03/26/24 136 lb 4 oz (61.8 kg)  06/13/23 125 lb (56.7 kg)  09/17/22 122 lb 0.6 oz (55.4 kg)      Physical Exam Vitals and nursing note reviewed.  Constitutional:      General: She is not in acute distress.    Appearance:  Normal appearance. She is not ill-appearing, toxic-appearing or diaphoretic.  HENT:     Head: Normocephalic and atraumatic.   Eyes:     General:        Right eye: No discharge.        Left eye: No discharge.     Extraocular Movements: Extraocular movements intact.     Pupils: Pupils are equal, round, and reactive to light.    Cardiovascular:     Rate and Rhythm: Normal rate.  Pulmonary:     Effort: Pulmonary effort is normal. No respiratory distress.  Abdominal:     General: Abdomen is flat. There is no distension.     Palpations: Abdomen is soft.     Tenderness: There is no abdominal tenderness. There is no right CVA tenderness, left CVA tenderness, guarding or rebound.   Skin:    General: Skin is warm and dry.     Coloration: Skin is not jaundiced.     Findings: No bruising, erythema or rash.   Neurological:     Mental Status: She is alert and oriented to person, place, and time.      POCT URINALYSIS DIP (CLINITEK)  Result Value Ref Range   Color, UA yellow yellow   Clarity, UA cloudy (A) clear   Glucose, UA negative negative mg/dL   Bilirubin, UA negative negative   Ketones, POC UA negative negative mg/dL   Spec Grav, UA 8.984 8.989 - 1.025   Blood, UA negative negative   pH, UA 7.0 5.0 - 8.0   POC PROTEIN,UA negative negative, trace   Urobilinogen, UA 1.0 0.2 or 1.0 E.U./dL   Nitrite, UA Negative Negative   Leukocytes, UA Trace (A) Negative    Last CBC Lab Results  Component Value Date   WBC 7.1 06/13/2023   HGB 13.1 06/13/2023   HCT 39.7 06/13/2023   MCV 93 06/13/2023   MCH 30.8 06/13/2023   RDW 11.7 06/13/2023   PLT 321 06/13/2023   Last metabolic panel Lab Results  Component Value Date   GLUCOSE 94 06/13/2023   NA 138 06/13/2023   K 4.4 06/13/2023   CL 104 06/13/2023   CO2 21 06/13/2023   BUN 12 06/13/2023   CREATININE 0.58 06/13/2023   EGFR 130 06/13/2023   CALCIUM 9.4 06/13/2023   PROT 6.4 06/13/2023   ALBUMIN 4.2 06/13/2023   LABGLOB  2.2 06/13/2023   BILITOT 0.2 06/13/2023   ALKPHOS 61 06/13/2023   AST 20 06/13/2023   ALT 17 06/13/2023      The ASCVD Risk score (Arnett DK, et al., 2019) failed to calculate for the following reasons:   The 2019 ASCVD risk score is only valid for ages 30 to 60  Assessment & Plan:  Urinary frequency -     POCT  URINALYSIS DIP (CLINITEK) -     Urine Culture -     Cephalexin ; Take 1 capsule (500 mg total) by mouth 2 (two) times daily for 7 days.  Dispense: 14 capsule; Refill: 0 -     Urine Microscopic -     Specimen status report  Migraine without status migrainosus, not intractable, unspecified migraine type -     Topiramate ; Take 1 tablet (50 mg total) by mouth 2 (two) times daily.  Dispense: 180 tablet; Refill: 1  Vaginal discharge -     NuSwab Vaginitis Plus (VG+)  Assessment and Plan    Urinary Tract Infection (UTI) Intermittent dysuria and urgency. Urinalysis negative for blood, leukocytes, nitrates. Treating as infectious pending culture. Discussed alcohol as potential irritant. - Order urine culture. - Prescribe cephalexin  Bid - Order urine microscopic analysis for crystals. - Consider interstitial cystitis workup if culture is negative.  Migraine Chronic migraines well-managed with topiramate . - Refill topiramate  prescription.   Vaginal discharge - Nuswab obtained, will tx pending results        No follow-ups on file.   Benton LITTIE Gave, PA

## 2024-03-26 NOTE — Patient Instructions (Signed)
 Please start the cephalexin  twice daily until gone. We will send results through Mychart.  We have refilled the topamax  for you

## 2024-03-29 ENCOUNTER — Ambulatory Visit: Payer: Self-pay | Admitting: Urgent Care

## 2024-03-29 DIAGNOSIS — N760 Acute vaginitis: Secondary | ICD-10-CM

## 2024-03-29 MED ORDER — METRONIDAZOLE 500 MG PO TABS: 500.0000 mg | ORAL_TABLET | Freq: Two times a day (BID) | ORAL | 0 refills | Status: AC

## 2024-03-30 LAB — NUSWAB VAGINITIS PLUS (VG+)
Atopobium vaginae: HIGH {score} — AB
BVAB 2: HIGH {score} — AB
Candida albicans, NAA: POSITIVE — AB

## 2024-03-30 MED ORDER — FLUCONAZOLE 150 MG PO TABS: ORAL_TABLET | ORAL | 0 refills | Status: DC

## 2024-03-31 LAB — URINE CULTURE

## 2024-03-31 LAB — SPECIMEN STATUS REPORT

## 2024-04-06 ENCOUNTER — Encounter: Payer: Self-pay | Admitting: Urgent Care

## 2024-04-06 DIAGNOSIS — N3000 Acute cystitis without hematuria: Secondary | ICD-10-CM

## 2024-04-06 MED ORDER — SULFAMETHOXAZOLE-TRIMETHOPRIM 800-160 MG PO TABS: 1.0000 | ORAL_TABLET | Freq: Two times a day (BID) | ORAL | 0 refills | Status: AC

## 2024-04-07 LAB — URINALYSIS, MICROSCOPIC ONLY

## 2024-04-28 ENCOUNTER — Encounter: Payer: Self-pay | Admitting: Urgent Care

## 2024-04-28 DIAGNOSIS — N3000 Acute cystitis without hematuria: Secondary | ICD-10-CM

## 2024-04-28 DIAGNOSIS — N760 Acute vaginitis: Secondary | ICD-10-CM

## 2024-04-28 MED ORDER — NITROFURANTOIN MONOHYD MACRO 100 MG PO CAPS: 100.0000 mg | ORAL_CAPSULE | Freq: Two times a day (BID) | ORAL | 0 refills | Status: DC

## 2024-04-28 MED ORDER — FLUCONAZOLE 150 MG PO TABS: ORAL_TABLET | ORAL | 0 refills | Status: DC

## 2024-08-24 ENCOUNTER — Encounter: Payer: Self-pay | Admitting: Medical-Surgical

## 2024-08-24 ENCOUNTER — Ambulatory Visit (INDEPENDENT_AMBULATORY_CARE_PROVIDER_SITE_OTHER): Admitting: Medical-Surgical

## 2024-08-24 ENCOUNTER — Other Ambulatory Visit (HOSPITAL_COMMUNITY)
Admission: RE | Admit: 2024-08-24 | Discharge: 2024-08-24 | Disposition: A | Source: Ambulatory Visit | Attending: Medical-Surgical | Admitting: Medical-Surgical

## 2024-08-24 VITALS — BP 100/68 | HR 97 | Resp 20 | Ht 65.0 in | Wt 120.0 lb

## 2024-08-24 DIAGNOSIS — G43909 Migraine, unspecified, not intractable, without status migrainosus: Secondary | ICD-10-CM | POA: Diagnosis not present

## 2024-08-24 DIAGNOSIS — Z113 Encounter for screening for infections with a predominantly sexual mode of transmission: Secondary | ICD-10-CM | POA: Diagnosis not present

## 2024-08-24 DIAGNOSIS — Z124 Encounter for screening for malignant neoplasm of cervix: Secondary | ICD-10-CM | POA: Diagnosis present

## 2024-08-24 DIAGNOSIS — R350 Frequency of micturition: Secondary | ICD-10-CM | POA: Diagnosis not present

## 2024-08-24 DIAGNOSIS — H9191 Unspecified hearing loss, right ear: Secondary | ICD-10-CM

## 2024-08-24 LAB — POCT URINALYSIS DIP (CLINITEK)
Bilirubin, UA: NEGATIVE
Blood, UA: NEGATIVE
Glucose, UA: NEGATIVE mg/dL
Ketones, POC UA: NEGATIVE mg/dL
Leukocytes, UA: NEGATIVE
Nitrite, UA: NEGATIVE
POC PROTEIN,UA: NEGATIVE
Spec Grav, UA: 1.025 (ref 1.010–1.025)
Urobilinogen, UA: 0.2 U/dL
pH, UA: 5.5 (ref 5.0–8.0)

## 2024-08-24 MED ORDER — TOPIRAMATE 100 MG PO TABS: 100.0000 mg | ORAL_TABLET | Freq: Two times a day (BID) | ORAL | 1 refills | Status: AC

## 2024-08-24 NOTE — Progress Notes (Signed)
 Established patient visit   History of Present Illness   Discussed the use of AI scribe software for clinical note transcription with the patient, who gave verbal consent to proceed.  History of Present Illness   Diana Carpenter is a 23 year old female who presents with vaginal discomfort, ear congestion, and migraine management.  Vaginal discomfort and discharge - Onset of symptoms yesterday - Change in vaginal discharge with fishy odor - Increased frequency of urination - Vaginal itchiness - Regularly wears thongs and suspects that may contribute to symptoms  Ear congestion and hearing loss - Right ear congestion persisting for approximately one month following an illness - Significant hearing loss in the right ear - Left ear beginning to be affected - Frequent use of earbuds  Migraine management - Currently taking Topamax  50 mg twice daily - Migraines are gradually returning   Physical Exam   Physical Exam Vitals reviewed.  Constitutional:      General: She is not in acute distress.    Appearance: Normal appearance. She is not ill-appearing.  HENT:     Head: Normocephalic and atraumatic.     Right Ear: There is impacted cerumen.     Left Ear: Tympanic membrane, ear canal and external ear normal. There is no impacted cerumen.  Cardiovascular:     Rate and Rhythm: Normal rate and regular rhythm.     Pulses: Normal pulses.     Heart sounds: Normal heart sounds. No murmur heard.    No friction rub. No gallop.  Pulmonary:     Effort: Pulmonary effort is normal. No respiratory distress.     Breath sounds: Normal breath sounds. No wheezing.  Skin:    General: Skin is warm and dry.  Neurological:     Mental Status: She is alert and oriented to person, place, and time.  Psychiatric:        Mood and Affect: Mood normal.        Behavior: Behavior normal.        Thought Content: Thought content normal.        Judgment: Judgment normal.    Assessment & Plan    Assessment and Plan    Screening for sexually transmitted infections Vaginal itching and discharge with increased urinary frequency suggest bacterial vaginosis, yeast infection, other STI. Two new sexual partners recently. - Performed wet prep for bacterial vaginosis, yeast, and trichomonas. - POCT UA normal. - Conducted STI profile including HIV, hepatitis B, hepatitis C, and syphilis. - Evaluation gonorrhea and chlamydia added to pap smear sample.  Screening for cervical malignancy Due for cervical cancer screening after ASCUS on Pap smear in February 2022. - Performed Pap smear.  Migraine Migraines returning despite Topamax  50 mg twice daily. Discussed increasing dose for better management. - Increased Topamax  to 100 mg twice daily.  Right ear hearing loss Right ear clogged or muffled, left ear is starting to get there. Symptoms post-illness, possibly related to earbud use. - Right ear canal with impacted cerumen.  - Irrigation completed with a large amount of cerumen removed. - Recommend Debrox wax softening drops for home use.  Indication: Cerumen impaction of the ear(s) Medical necessity statement: On physical examination, cerumen impairs clinically significant portions of the external auditory canal, and tympanic membrane. Noted obstructive, copious cerumen that cannot be removed without magnification and instrumentations  Consent: Discussed benefits and risks of procedure and verbal consent obtained Procedure: Patient was prepped for the procedure. Utilized an architect  to assess and take note of the ear canal, the tympanic membrane, and the presence, amount, and placement of the cerumen. Gentle water irrigation and soft plastic curette was utilized to remove cerumen.  Post procedure examination: shows cerumen was completely removed. Patient tolerated procedure fairly well. The patient is made aware that they may experience temporary vertigo, temporary hearing loss, and  temporary discomfort. If these symptom last for more than 24 hours to call the clinic or proceed to the ED.   Follow up   Return in about 6 weeks (around 10/05/2024) for migraine follow up. __________________________________ Zada FREDRIK Palin, DNP, APRN, FNP-BC Primary Care and Sports Medicine [facility] [place]

## 2024-08-25 ENCOUNTER — Ambulatory Visit: Payer: Self-pay | Admitting: Medical-Surgical

## 2024-08-25 LAB — WET PREP FOR TRICH, YEAST, CLUE
Clue Cell Exam: POSITIVE — AB
Trichomonas Exam: NEGATIVE
Yeast Exam: NEGATIVE

## 2024-08-25 LAB — STI PROFILE
HCV Ab: NONREACTIVE
HIV Screen 4th Generation wRfx: NONREACTIVE
Hep B Core Total Ab: NEGATIVE
Hep B Surface Ab, Qual: NONREACTIVE
Hepatitis B Surface Ag: NEGATIVE
RPR Ser Ql: NONREACTIVE

## 2024-08-25 LAB — HCV INTERPRETATION

## 2024-08-25 MED ORDER — METRONIDAZOLE 500 MG PO TABS: 500.0000 mg | ORAL_TABLET | Freq: Two times a day (BID) | ORAL | 0 refills | Status: AC

## 2024-08-27 LAB — CYTOLOGY - PAP
Chlamydia: NEGATIVE
Comment: NEGATIVE
Comment: NORMAL
Diagnosis: NEGATIVE
Neisseria Gonorrhea: NEGATIVE

## 2024-09-17 ENCOUNTER — Ambulatory Visit: Payer: Self-pay

## 2024-09-17 NOTE — Telephone Encounter (Signed)
 Attempt # 2 to reach patient to triage symptoms. Left VM to call back   No contact- routing to clinic Reason for Disposition  Message left on unidentified voice mail. Phone number verified.  Protocols used: No Contact or Duplicate Contact Call-A-AH

## 2024-09-17 NOTE — Telephone Encounter (Signed)
 Attempt # 1 to reach patient to triage symptoms. Left VM to call back    Copied from CRM #[id number]. Topic: Clinical - Medical Advice >> Sep 17, 2024  9:50 AM Travis F wrote: Reason for CRM: Patient is calling in because she was seen by the doctor and was checked in her vaginal area. Patient says everything was fine, but she had unprotected intercourse about a week ago and she's been bleeding heavily ever since. Patient says she's experiencing cramping as well. Patient says there is a strong metallic smell as well.

## 2024-09-20 NOTE — Telephone Encounter (Signed)
 Spoke with the patient states she was bleeding heavy on and off x 1 1/2 to 2  weeks. she is not currently bleeding and odor has resolved for the moment. Offered several appts but patient had conflicting appts and work that she could not schedule at the time of the call. She will call us  back to schedule a visit if the bleeding returns.

## 2024-10-01 ENCOUNTER — Encounter: Payer: Self-pay | Admitting: Medical-Surgical

## 2024-10-01 ENCOUNTER — Ambulatory Visit: Admitting: Medical-Surgical

## 2024-10-01 VITALS — BP 104/70 | HR 83 | Resp 20 | Ht 65.0 in | Wt 125.0 lb

## 2024-10-01 DIAGNOSIS — N921 Excessive and frequent menstruation with irregular cycle: Secondary | ICD-10-CM | POA: Diagnosis not present

## 2024-10-01 DIAGNOSIS — R350 Frequency of micturition: Secondary | ICD-10-CM | POA: Diagnosis not present

## 2024-10-01 DIAGNOSIS — Z8679 Personal history of other diseases of the circulatory system: Secondary | ICD-10-CM | POA: Diagnosis not present

## 2024-10-01 DIAGNOSIS — N898 Other specified noninflammatory disorders of vagina: Secondary | ICD-10-CM | POA: Diagnosis not present

## 2024-10-01 DIAGNOSIS — Z975 Presence of (intrauterine) contraceptive device: Secondary | ICD-10-CM | POA: Diagnosis not present

## 2024-10-01 DIAGNOSIS — Z87898 Personal history of other specified conditions: Secondary | ICD-10-CM | POA: Diagnosis not present

## 2024-10-01 DIAGNOSIS — R102 Pelvic and perineal pain unspecified side: Secondary | ICD-10-CM

## 2024-10-01 LAB — POCT URINALYSIS DIP (CLINITEK)
Bilirubin, UA: NEGATIVE
Blood, UA: NEGATIVE
Glucose, UA: NEGATIVE mg/dL
Ketones, POC UA: NEGATIVE mg/dL
Leukocytes, UA: NEGATIVE
Nitrite, UA: NEGATIVE
POC PROTEIN,UA: NEGATIVE
Spec Grav, UA: 1.02
Urobilinogen, UA: 0.2 U/dL
pH, UA: 7
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
## 2024-10-02 ENCOUNTER — Ambulatory Visit: Payer: Self-pay | Admitting: Medical-Surgical

## 2024-10-02 DIAGNOSIS — R748 Abnormal levels of other serum enzymes: Secondary | ICD-10-CM

## 2024-10-02 DIAGNOSIS — B9689 Other specified bacterial agents as the cause of diseases classified elsewhere: Secondary | ICD-10-CM

## 2024-10-02 LAB — CBC WITH DIFFERENTIAL/PLATELET
Basophils Absolute: 0 x10E3/uL (ref 0.0–0.2)
Basos: 1 %
EOS (ABSOLUTE): 0.1 x10E3/uL (ref 0.0–0.4)
Eos: 1 %
Hematocrit: 42.6 % (ref 34.0–46.6)
Hemoglobin: 14.4 g/dL (ref 11.1–15.9)
Immature Grans (Abs): 0 x10E3/uL (ref 0.0–0.1)
Immature Granulocytes: 0 %
Lymphocytes Absolute: 1.5 x10E3/uL (ref 0.7–3.1)
Lymphs: 19 %
MCH: 31 pg (ref 26.6–33.0)
MCHC: 33.8 g/dL (ref 31.5–35.7)
MCV: 92 fL (ref 79–97)
Monocytes Absolute: 0.7 x10E3/uL (ref 0.1–0.9)
Monocytes: 8 %
Neutrophils Absolute: 5.5 x10E3/uL (ref 1.4–7.0)
Neutrophils: 71 %
Platelets: 389 x10E3/uL (ref 150–450)
RBC: 4.65 x10E6/uL (ref 3.77–5.28)
RDW: 12.2 % (ref 11.7–15.4)
WBC: 7.7 x10E3/uL (ref 3.4–10.8)

## 2024-10-02 LAB — WET PREP FOR TRICH, YEAST, CLUE
Clue Cell Exam: POSITIVE — AB
Trichomonas Exam: NEGATIVE
Yeast Exam: NEGATIVE

## 2024-10-02 LAB — COMPREHENSIVE METABOLIC PANEL WITH GFR
ALT: 48 IU/L — ABNORMAL HIGH (ref 0–32)
AST: 44 IU/L — ABNORMAL HIGH (ref 0–40)
Albumin: 4.6 g/dL (ref 4.0–5.0)
Alkaline Phosphatase: 60 IU/L (ref 41–116)
BUN/Creatinine Ratio: 15 (ref 9–23)
BUN: 9 mg/dL (ref 6–20)
Bilirubin Total: 0.4 mg/dL (ref 0.0–1.2)
CO2: 22 mmol/L (ref 20–29)
Calcium: 9.9 mg/dL (ref 8.7–10.2)
Chloride: 105 mmol/L (ref 96–106)
Creatinine, Ser: 0.62 mg/dL (ref 0.57–1.00)
Globulin, Total: 2.2 g/dL (ref 1.5–4.5)
Glucose: 92 mg/dL (ref 70–99)
Potassium: 4.3 mmol/L (ref 3.5–5.2)
Sodium: 139 mmol/L (ref 134–144)
Total Protein: 6.8 g/dL (ref 6.0–8.5)
eGFR: 127 mL/min/1.73

## 2024-10-02 LAB — IRON,TIBC AND FERRITIN PANEL
Ferritin: 78 ng/mL (ref 15–150)
Iron Saturation: 37 % (ref 15–55)
Iron: 122 ug/dL (ref 27–159)
Total Iron Binding Capacity: 333 ug/dL (ref 250–450)
UIBC: 211 ug/dL (ref 131–425)

## 2024-10-02 MED ORDER — METRONIDAZOLE 500 MG PO TABS: 500.0000 mg | ORAL_TABLET | Freq: Two times a day (BID) | ORAL | 0 refills | Status: AC

## 2024-10-03 LAB — CHLAMYDIA/GONOCOCCUS/TRICHOMONAS, NAA
Chlamydia by NAA: NEGATIVE
Gonococcus by NAA: NEGATIVE
Trich vag by NAA: NEGATIVE

## 2024-10-04 ENCOUNTER — Other Ambulatory Visit

## 2024-10-04 ENCOUNTER — Ambulatory Visit

## 2024-10-04 DIAGNOSIS — R350 Frequency of micturition: Secondary | ICD-10-CM

## 2024-10-04 DIAGNOSIS — N939 Abnormal uterine and vaginal bleeding, unspecified: Secondary | ICD-10-CM | POA: Diagnosis not present

## 2024-10-04 DIAGNOSIS — Z975 Presence of (intrauterine) contraceptive device: Secondary | ICD-10-CM | POA: Diagnosis not present

## 2024-10-04 DIAGNOSIS — N898 Other specified noninflammatory disorders of vagina: Secondary | ICD-10-CM

## 2024-10-04 DIAGNOSIS — R102 Pelvic and perineal pain unspecified side: Secondary | ICD-10-CM | POA: Diagnosis not present

## 2024-10-25 ENCOUNTER — Ambulatory Visit: Admitting: Medical-Surgical

## 2024-11-01 ENCOUNTER — Ambulatory Visit: Admitting: Medical-Surgical

## 2024-11-16 ENCOUNTER — Ambulatory Visit: Admitting: Medical-Surgical
